# Patient Record
Sex: Female | Born: 1970 | Race: White | Hispanic: No | Marital: Married | Smoking: Never smoker
Health system: Southern US, Community
[De-identification: ages and names within clinical notes are randomized; demographics above are authoritative.]

## PROBLEM LIST (undated history)

## (undated) DIAGNOSIS — B9681 Helicobacter pylori [H. pylori] as the cause of diseases classified elsewhere: Secondary | ICD-10-CM

## (undated) DIAGNOSIS — Z973 Presence of spectacles and contact lenses: Secondary | ICD-10-CM

## (undated) DIAGNOSIS — K759 Inflammatory liver disease, unspecified: Secondary | ICD-10-CM

## (undated) DIAGNOSIS — Z9889 Other specified postprocedural states: Secondary | ICD-10-CM

## (undated) DIAGNOSIS — N281 Cyst of kidney, acquired: Secondary | ICD-10-CM

## (undated) HISTORY — PX: OTHER SURGICAL HISTORY: SHX169

---

## 2009-12-14 ENCOUNTER — Inpatient Hospital Stay (HOSPITAL_COMMUNITY): Admission: RE | Admit: 2009-12-14 | Discharge: 2009-12-16 | Payer: Self-pay | Admitting: Obstetrics & Gynecology

## 2009-12-17 ENCOUNTER — Inpatient Hospital Stay (HOSPITAL_COMMUNITY): Admission: AD | Admit: 2009-12-17 | Discharge: 2009-12-17 | Payer: Self-pay | Admitting: Obstetrics and Gynecology

## 2010-09-10 LAB — CBC
Hemoglobin: 13.1 g/dL (ref 12.0–15.0)
MCH: 34.3 pg — ABNORMAL HIGH (ref 26.0–34.0)
MCHC: 35.3 g/dL (ref 30.0–36.0)
MCHC: 35.4 g/dL (ref 30.0–36.0)
MCV: 95.9 fL (ref 78.0–100.0)
Platelets: 120 10*3/uL — ABNORMAL LOW (ref 150–400)
Platelets: 139 10*3/uL — ABNORMAL LOW (ref 150–400)
RDW: 14 % (ref 11.5–15.5)
RDW: 14.1 % (ref 11.5–15.5)
RDW: 14.4 % (ref 11.5–15.5)
WBC: 13.1 10*3/uL — ABNORMAL HIGH (ref 4.0–10.5)

## 2010-09-10 LAB — COMPREHENSIVE METABOLIC PANEL
ALT: 17 U/L (ref 0–35)
AST: 25 U/L (ref 0–37)
Albumin: 2.8 g/dL — ABNORMAL LOW (ref 3.5–5.2)
Calcium: 8.6 mg/dL (ref 8.4–10.5)
GFR calc Af Amer: >60 mL/min (ref >60)
Sodium: 138 mEq/L (ref 135–145)
Total Protein: 5.6 g/dL — ABNORMAL LOW (ref 6.0–8.3)

## 2012-04-22 ENCOUNTER — Encounter: Payer: Self-pay | Admitting: Family Medicine

## 2012-04-22 ENCOUNTER — Ambulatory Visit (INDEPENDENT_AMBULATORY_CARE_PROVIDER_SITE_OTHER): Payer: 59 | Admitting: Family Medicine

## 2012-04-22 VITALS — BP 112/72 | HR 82 | Temp 98.1°F | Resp 16 | Ht 61.5 in | Wt 151.0 lb

## 2012-04-22 DIAGNOSIS — Z0000 Encounter for general adult medical examination without abnormal findings: Secondary | ICD-10-CM

## 2012-04-22 DIAGNOSIS — K219 Gastro-esophageal reflux disease without esophagitis: Secondary | ICD-10-CM

## 2012-04-22 DIAGNOSIS — J309 Allergic rhinitis, unspecified: Secondary | ICD-10-CM

## 2012-04-22 LAB — POCT URINALYSIS DIPSTICK
Bilirubin, UA: NEGATIVE
Glucose, UA: NEGATIVE
Nitrite, UA: NEGATIVE

## 2012-04-22 LAB — LIPID PANEL
HDL: 41 mg/dL (ref >39)
Triglycerides: 82 mg/dL (ref <150)

## 2012-04-22 LAB — COMPREHENSIVE METABOLIC PANEL
AST: 17 U/L (ref 0–37)
Albumin: 4.2 g/dL (ref 3.5–5.2)
BUN: 16 mg/dL (ref 6–23)
Calcium: 9.4 mg/dL (ref 8.4–10.5)
Chloride: 106 mEq/L (ref 96–112)
Glucose, Bld: 91 mg/dL (ref 70–99)
Potassium: 4.4 mEq/L (ref 3.5–5.3)

## 2012-04-22 MED ORDER — OMEPRAZOLE 20 MG PO CPDR: 20.0000 mg | DELAYED_RELEASE_CAPSULE | Freq: Every day | ORAL | Status: DC

## 2012-04-22 NOTE — Patient Instructions (Addendum)
Keeping You Healthy  Get These Tests 1. Blood Pressure- Have your blood pressure checked once a year by your health care provider.  Normal blood pressure is 120/80. 2. Weight- Have your body mass index (BMI) calculated to screen for obesity.  BMI is measure of body fat based on height and weight.  You can also calculate your own BMI at [email or web address]-[email or web address] 3. Cholesterol- Have your cholesterol checked every 5 years starting at age 39 then yearly starting at age 64. 4. Chlamydia, HIV, and other sexually transmitted diseases- Get screened every year until age 4, then within three months of each new sexual provider. 5. Pap Smear- Every 1-3 years; discuss with your health care provider. 6. Mammogram- Every year starting at age 60  Take these medicines  Calcium with Vitamin D-Your body needs 1200 mg of Calcium each day and [phone] IU of Vitamin D daily.  Your body can only absorb 500 mg of Calcium at a time so Calcium must be taken in 2 or 3 divided doses throughout the day.  Multivitamin with folic acid- Once daily if it is possible for you to become pregnant.  Get these Immunizations  Gardasil-Series of three doses; prevents HPV related illness such as genital warts and cervical cancer.  Menactra-Single dose; prevents meningitis.  Tetanus shot- Every 10 years.  Flu shot-Every year.  Take these steps 1. Do not smoke-Your healthcare provider can help you quit.  For tips on how to quit go to www.smokefree.gov or call 1-800 QUITNOW. 2. Be physically active- Exercise 5 days a week for at least 30 minutes.  If you are not already physically active, start slow and gradually work up to 30 minutes of moderate physical activity.  Examples of moderate activity include walking briskly, dancing, swimming, bicycling, etc. 3. Breast Cancer- A self breast exam every month is important for early detection of breast cancer.  For more information and instruction on self breast exams, ask your  healthcare provider or [email or web address]. 4. Eat a healthy diet- Eat a variety of healthy foods such as fruits, vegetables, whole grains, low fat milk, low fat cheeses, yogurt, lean meats, poultry and fish, beans, nuts, tofu, etc.  For more information go to www. Thenutritionsource.org 5. Drink alcohol in moderation- Limit alcohol intake to one drink or less per day. Never drink and drive. 6. Depression- Your emotional health is as important as your physical health.  If you're feeling down or losing interest in things you normally enjoy please talk to your healthcare provider about being screened for depression. 7. Dental visit- Brush and floss your teeth twice daily; visit your dentist twice a year. 8. Eye doctor- Get an eye exam at least every 2 years. 9. Helmet use- Always wear a helmet when riding a bicycle, motorcycle, rollerblading or skateboarding. 10. Safe sex- If you may be exposed to sexually transmitted infections, use a condom. 11. Seat belts- Seat belts can save your live; always wear one. 12. Smoke/Carbon Monoxide detectors- These detectors need to be installed on the appropriate level of your home. Replace batteries at least once a year. 13. Skin cancer- When out in the sun please cover up and use sunscreen 15 SPF or higher. 14. Violence- If anyone is threatening or hurting you, please tell your healthcare provider.    I recommend you get a multivitamin for Women under age 30  And take 1 tablet daily along with Vitamin D3 supplement (1000- 2000) IU and take 1 capsule daily.   ??????? ???????? D (Vitamin D  Deficiency) ??????? D ?????? ?????? ???? ? ????????? ????????. ?????? ??????? ???????? D ?????????? ??? ??????????? ??? ?????????. ???????????? ?????????? ???????? D ????? ???????? ? ??????????? ?????? ? ????? ???????? ?????? ???????????, ??? ?????.  ??????? D ????? ??? ????????? ?? ?????? ????????:   ?? ????????? ? ???????? 2-? ????????? - ??????? ?  ???????.  ??????? D ???????? ? ??????????? ???????? ?????? ???????.  ?? ????? ????????????? ????? ???????????, ??? ?????? ? ?????????? ???????.  ?? ????????? ????? ? ??????. ??????? D ???????? ? ???????? ??????? ?????????. ?? ?????? ? ?????? ????????? ????????? ???????. ????????? D ????? ????????? ????????? ???????? ???????? ? ?????? ? ????. ????????? ???? ????????? ??????? ???????, ?????????? ??????? D. ????? ??????? D ????????????? ? ????????? ???????? ??? ???????????? ?????????? ?????????. ? ???????? ??????? ????? ?????????? ????????? ???????????? ??? ????????? ????? ????????, ??????? ??? ???????? ????? ????????????.  ???????   ?????????? ????????? ???????, ?????????? ??????? D.  ???????? ?????????? ?????.  ??????? ????????? ????????? ??????????????? ???????, ??-?? ??????? ?????????? ????????? ???????? D. ? ????? ????? ??????????? ?????? ??????? ????? (??????????????? ???????), ??????????? ??????????, ???????????? (????????-????????? ???????????).  ????????????? ???????? ?? ???????? ????? ??????? ??? ?????? ?????.  ????????. ??????? ?????? ??????????? ??????? D ?? ?????. ??? ??????, ??? ? ?????? ????? ????? ??????????? ?????????? ???????? D ? ????? ? ? ?????? ?????? ?????????.  ??????????? ???????? ??????????????? ??? ??????????? ??????????? ??????. ??????? ?????  ??????? ????? - ???????, ??? ??????? ??????? ??????????? ???????? ?????????? ???????? D ?????????????. ??? ???????? ? ????:   ????????.  ?????????? ??????????? ????????? ??????????? ??????? ?? ???????? ???????.  ?????????? ? ???? ???????????.  ?????? ????????? ??????.  ?????? ??? ????? ????? (??????????).  ??????????? ??? ????????????, ? ?????????? ??????? ?????????? ????????? ???????? D ??????????.  ?????? ????.  ????? ????????? ??????????, ????????, ???????? ?? ????????? (????????, ?????????) ??? ?????????.  ?????????? ??? (????????). ????????  ?????????????? ?????????? ???????? D ????? ?? ????? ?????-????  ??????????? ?????????. ???? ? ??? ???????????? ???????? ???????? D, ???????? ????? ???????? ? ????:   ???? ? ??????.  ???????? ????.  ?????? ???????.  ?????? ???????? ????? ?????????????? ????? ??-?? ???????????? ???????????. ???????  ?????? ????? - ?????? ?????? ??????????? ???????? ???????? D.  ???????  ??????? ???????? D ????? ?????????? ?????????? ?????????. ??????? ???????? ???????? D ??????? ?? ?????? ??? ?????????????. ???????? ???????:   ????? ???????, ?????????? ??????? D.  ????? ???????, ?????????? ???????. ??? ??????? ???? ?????, ????? ??????? ??????? ?????? ???????? ???. ?????????? ?? ????? ? ???????? ????????   ?????????? ??? ??????????? ??????? ???????. ???????? ???? ??????????? ?????. ?????????? ?????? ??????????? ?????/?????????? ?????????.  ???????? ??????? ????? ?????? 2 ?????? ????? ?????? ????? ??????????.  ???????????? ? ???? ????????, ?????????? ??????? D. ??????????????? ???????? ????????:  ??????????? ???????? ????????, ???? ??? ????. "???????????" ????????, ??? ??????? D ??? ???????? ? ???????. ??????? ???????? ?? ????????, ????? ???? ??????????.  ?????? ????, ????????, ?????? ??? ??????.  ????.  ???????.  ????????? ?????? ??????? ?? ??????. ??????????? ????? ????????????? ????????? ??? ???????? ??????? ??? ??????????????? ????? ?? 10 ?? 15 ?????. ?????????? ???????? ????? 3 ???? ? ??????. ?????, ??????? ????????? ??? ????, ?? ????????????? ????????? ????????? ?????. ???????? ? ?????? ???????? ?????, ??????? ??????? ??? ????? ????????? ??? ???????? ???????. ?? ????????? ?? ??????? ??? ??????.  ????????????? ????? ???? ?? ?????????? ??????. ???? ??????????, ???????? ???.  ??????? ?? ??? ??????????? ?????? ? ?????. ??? ??????? ???? ???????? ?????? ?????, ????? ??????? ? ????????????? ???????? ??????? ???????? ???????? D. ?????????? ? ?????, ????:   ? ??? ???? ??????? ? ??????????? ??? ???????.  ? ??? ???????? ???????? ???????? ???????? D.  ? ???  ??????? ??? ?????.  ? ??? ?????.  ?? ?????????? ?????????? ????????.  ? ??? ???????? ?????? ???? ? ??????? ??????? ??? ? ?????. ?????????, ??? ??:   ????????? ????????? ??????????.  ?????? ?????????????? ??????? ?? ??????????.  ??????????????? ?????????? ? ?????, ???? ??? ?? ?????????? ????? ??? ?????????? ????.  Document Released: 09/03/2011 [facility] Patient Information 2013 [employer], [place].   ?????? ?? ????? ???????????? (Heartburn During Pregnancy)  ?????? - ???????? ?????? ? ??????? ??????, ??????? ??????? ?????????? ??????????????? ????? (?????????? ???????) ? ???????. ?????? (????? ????????? ??? "???????") ????? ????????? ?? ????? ???????????? ??-?? ????????? ? ???????????? ??????? (????????????) ? ?????. ??????????? ????? ????????? ? ????, ??? ????????????? ?????? ????? ????????? ? ????????. ????? ?????? ??????? ???????? ? ???????, ??????? ??????. ?????? ????? ????? ????????? ??-?? ?????????? ?????, ??????? ????? ?? ???????, ??????? ????????? ??????? ? ???????. ??? ???????? ????? ??????????? ?? ????????? ??????? ????????????. ?????? ?????? ???????????? ????? ?????. ???????  ???????????.  ????????? ????????????? ????.  ?????????? ???????? ?????, ??-?? ???? ?????????? ???????????? ??????????????? ??????? ?????.  ??????? ?????? ???.  ???????????? ???????? ? ???????.  ?????????? ????????.  ?????????? ???????????? ??????????????? ???????. ????????  ?????? ? ????? ??? ?????.  ??????? ??????? ?? ???.  ??????. ??????????? ?????? ?????? ??????????????? ?????? ?? ????????? ???????? ?????????. ??? ??????? ???? ????? ????????? ?????? ????? ?? ??????????? ????????????? ???? ????????, ??????? ?????? ?????? ??? ??????. ?????? ?????? ??????????????? ????? ?????????? ????????? ?? ??????, ????? ????????, ?????????? ?? ????????. ?? ????? ???????????? ????????? ??? ????????? ?????????? ??????????? ?????. ?????????? - ???????????? ???????? ? ??????? ??? ?????? ????? ? ??????? ?  ????????????? ??????????? ?? ??????????? ?????.  ???????  ??? ??????? ???? ????? ????????? ????? ????????? ?????????????? ?????????? (????????, ???????? ?? ??????, ?????????????? ???????) ??? ??????????? ????????? ?????? ??????.  ??? ??????? ???? ????? ????????? ????? ??????????, ??????? ????????? ?????????? ??????????????? ??????? ? ??????? ??? ???????? ????????? ???????? ???????.  ??? ??????? ???? ????? ????????????? ???????? ?????? ???????.  ? ??????? ??????? ??? ??????? ???? ????? ????????????? ???????????? ???????? ???????, ???? ?? ???????? ???????. (??? ?? ??????? ?????????? ??????? ?? ???????? ??????????? ????????, ?.?. ??? ???????? ?????? ? ????????? ????????? ?????? ?? ????? ??? ? ????? ?? ?????? ?? ???????? ???????? - ????????? ??????? ? ???????). ?????????? ?? ??????? ? ???????? ????????  ?????????? ????????????? ????????? ? ???????????? ? ???????????? ?????.  ???????????? ???????? ???????, ???????? ???-?????? ??????? ??? ?????, ???? ??? ???? ????????????? ??????? ??????.  ?? ??????? ?????????? ?????????? ????? ????? ???.  ????????? ??????? ???? ?? 2-3 ???? ????? ??????? ?? ???.  ?? ???????? ????? ????? ???.  ????? ?????????? ???????? ????????? ??? ? ???? ?????? 3 ??????? ??????? ????.  ?????????? ???????? ? ???????, ??????? ???????? ???????? ? ?? ???????????? ?? ? ????. ????????, ??????? ??????? ????????? ?? ???????:  ???????? ????????.  ???????.  ?????? ????, ??????? ????????.  ?????? ?????.  ??????, ???.  ??????????, ??????? ?????????, ?????????, ?????? ? ????.  ????????, ?????????? ????????.  ????.  ???????????? ??????? ??? ??????? ? ????????.  ?????. ?????????? ?????????? ? ?????, ????:  ? ??? ???????? ?????? ???? ? ?????, ??????? ?????? ? ???? ??? ???????, ??? ? ???.  ? ??? ??????? ??????????, ?????????????? ??? ???????? ? ??????.  ?? ??????? ???????.  ? ??? ????? ??????.  ??? ?????? ??? ?????? ???????.  ? ??? ???????? ?????? ?????????????  ????.  ???????? ?????? ????????? ???? ???? ??? ? ?????? ?? ?????????? ????? ???? ??????. ?????????, ??? ??:   ????????? ????????? ??????????.  ?????? ?????????????? ??????? ?? ??????????.  ??????????????? ?????????? ? ?????, ???? ??? ?? ?????????? ????? ??? ?????????? ????. Document Released: 06/11/2005 Document Revised: 09/03/2011 [facility] Patient Information 2013 [place], [place].

## 2012-04-23 ENCOUNTER — Encounter: Payer: Self-pay | Admitting: Family Medicine

## 2012-04-23 DIAGNOSIS — J309 Allergic rhinitis, unspecified: Secondary | ICD-10-CM | POA: Insufficient documentation

## 2012-04-23 DIAGNOSIS — E663 Overweight: Secondary | ICD-10-CM | POA: Insufficient documentation

## 2012-04-23 DIAGNOSIS — K219 Gastro-esophageal reflux disease without esophagitis: Secondary | ICD-10-CM | POA: Insufficient documentation

## 2012-04-23 LAB — VITAMIN D 25 HYDROXY (VIT D DEFICIENCY, FRACTURES): Vit D, 25-Hydroxy: 22 ng/mL — ABNORMAL LOW (ref 30–89)

## 2012-04-23 NOTE — Progress Notes (Signed)
Subjective:    Patient ID: Sue Watkins, female    DOB: 1971-03-30, 41 y.o.   MRN: [id number]  HPI  This 41 y.o. [place] female is here for CPE/ health screening for her job at [employer]. She c/o URI congestion with sore throat and ithcy eyes for several weeks. She has been afebrile. Cough is nonproductive. She has heartburn and was evaluated for GERD by Dr. Elnoria Howard about  2 years ago and OTC medication recommended because pt was breast-feeding at that time.  She stopped nursing in August 2013 and tried OTC Prilosec which did help; she is not  currently taking this med. OTC Dayquil has helped with URI symptoms.  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    Review of Systems  Constitutional: Negative.   HENT: Positive for congestion, sore throat, postnasal drip and sinus pressure. Negative for ear pain, facial swelling, rhinorrhea, mouth sores, trouble swallowing and voice change.   Eyes: Positive for itching.  Respiratory: Positive for cough. Negative for choking, chest tightness and shortness of breath.   Cardiovascular: Negative.   Gastrointestinal: Positive for blood in stool. Negative for nausea, abdominal pain, diarrhea and constipation.  Genitourinary: Negative.   All other systems reviewed and are negative.         Objective:   Physical Exam  Nursing note and vitals reviewed. Constitutional: She is oriented to person, place, and time. She appears well-developed and well-nourished. No distress.  HENT:  Head: Normocephalic and atraumatic.  Right Ear: Hearing, tympanic membrane, external ear and ear canal normal.  Left Ear: Hearing, tympanic membrane, external ear and ear canal normal.  Nose: Mucosal edema present. No rhinorrhea, nasal deformity or septal deviation. Right sinus exhibits no maxillary sinus tenderness and no frontal sinus tenderness. Left sinus exhibits no maxillary sinus  tenderness and no frontal sinus tenderness.  Mouth/Throat: Uvula is midline and mucous membranes are normal. No oral lesions. Normal dentition. No dental caries. Posterior oropharyngeal erythema present. No oropharyngeal exudate.  Eyes: Conjunctivae normal, EOM and lids are normal. Pupils are equal, round, and reactive to light. No scleral icterus.  Fundoscopic exam:      The right eye shows red reflex.      The left eye shows red reflex. Neck: Normal range of motion. Neck supple. No thyromegaly present.  Cardiovascular: Normal rate, regular rhythm, normal heart sounds and intact distal pulses.  Exam reveals no gallop and no friction rub.   No murmur heard. Pulmonary/Chest: Effort normal and breath sounds normal. No respiratory distress. She has no wheezes.  Abdominal: Soft. Bowel sounds are normal. She exhibits no distension and no mass. There is no hepatosplenomegaly. There is no tenderness. There is no guarding and no CVA tenderness.  Musculoskeletal: Normal range of motion. She exhibits no edema and no tenderness.  Lymphadenopathy:    She has no cervical adenopathy.  Neurological: She is alert and oriented to person, place, and time. She has normal reflexes. No cranial nerve deficit. She exhibits normal muscle tone. Coordination normal.  Skin: Skin is warm and dry. No rash noted. No erythema. No pallor.  Psychiatric: She has a normal mood and affect. Her behavior is normal. Judgment and thought content normal.          Assessment & Plan:   1. Routine general medical examination at a health care facility  Comprehensive metabolic panel, Lipid panel, Vitamin D 25 hydroxy, POCT  urinalysis dipstick  2. GERD (gastroesophageal reflux disease)  RX: Omeprazole 20 mg 1 capsule every AM  3. Allergic rhinitis  OTC generic Zyrtec or Allegra

## 2012-04-24 NOTE — Progress Notes (Signed)
Quick Note:  Please call pt and advise that the following labs are abnormal...  Vit D level is below normal; I did discuss with pt need for her to get OTC Vitamin D3 and take it in addition to multivitamin for women. Also increase fish intake along with mushrooms and some Vit D-fortified dairy products.  Get some sun exposure most days of the week.  All other labs are normal.  Copy to pt. ______

## 2012-04-29 ENCOUNTER — Telehealth: Payer: Self-pay

## 2012-04-29 NOTE — Telephone Encounter (Signed)
I spoke with husband. Had ?'s about her glucose because her ins company called and said she was pre-diabetic. I reassured her that her sugar was 91 and that was WNL and does not put her in the pre-diabetic range.

## 2012-04-29 NOTE — Telephone Encounter (Signed)
PT WOULD LIKE TO DISCUSS HER LAB RESULTS WITH SOMEONE. PLEASE CALL [phone]

## 2013-12-11 DIAGNOSIS — N840 Polyp of corpus uteri: Secondary | ICD-10-CM | POA: Insufficient documentation

## 2014-11-04 ENCOUNTER — Ambulatory Visit: Payer: Self-pay | Admitting: Podiatry

## 2014-11-10 ENCOUNTER — Ambulatory Visit: Payer: Self-pay | Admitting: Podiatry

## 2015-11-01 DIAGNOSIS — Z309 Encounter for contraceptive management, unspecified: Secondary | ICD-10-CM | POA: Diagnosis not present

## 2016-02-15 DIAGNOSIS — N644 Mastodynia: Secondary | ICD-10-CM | POA: Diagnosis not present

## 2016-02-23 DIAGNOSIS — N644 Mastodynia: Secondary | ICD-10-CM | POA: Diagnosis not present

## 2016-03-16 DIAGNOSIS — J309 Allergic rhinitis, unspecified: Secondary | ICD-10-CM | POA: Diagnosis not present

## 2016-03-16 DIAGNOSIS — K219 Gastro-esophageal reflux disease without esophagitis: Secondary | ICD-10-CM | POA: Diagnosis not present

## 2016-03-16 DIAGNOSIS — E663 Overweight: Secondary | ICD-10-CM | POA: Diagnosis not present

## 2016-03-16 DIAGNOSIS — J019 Acute sinusitis, unspecified: Secondary | ICD-10-CM | POA: Diagnosis not present

## 2016-03-23 ENCOUNTER — Telehealth: Payer: Self-pay | Admitting: Emergency Medicine

## 2016-03-23 NOTE — Telephone Encounter (Signed)
Pt called and wanted to know if you would except her and her husband Wilmon Armsndrew Irvin as new patients? Please advise thanks.

## 2016-03-27 NOTE — Telephone Encounter (Signed)
Ok Thx 

## 2016-04-09 DIAGNOSIS — N970 Female infertility associated with anovulation: Secondary | ICD-10-CM | POA: Diagnosis not present

## 2016-04-12 DIAGNOSIS — N970 Female infertility associated with anovulation: Secondary | ICD-10-CM | POA: Diagnosis not present

## 2016-04-13 DIAGNOSIS — Z3189 Encounter for other procreative management: Secondary | ICD-10-CM | POA: Diagnosis not present

## 2016-04-20 ENCOUNTER — Ambulatory Visit: Payer: Self-pay | Admitting: Internal Medicine

## 2016-04-30 ENCOUNTER — Ambulatory Visit (INDEPENDENT_AMBULATORY_CARE_PROVIDER_SITE_OTHER): Payer: BLUE CROSS/BLUE SHIELD | Admitting: Internal Medicine

## 2016-04-30 ENCOUNTER — Encounter: Payer: Self-pay | Admitting: Internal Medicine

## 2016-04-30 VITALS — BP 130/88 | HR 77 | Ht 61.0 in | Wt 146.0 lb

## 2016-04-30 DIAGNOSIS — K219 Gastro-esophageal reflux disease without esophagitis: Secondary | ICD-10-CM | POA: Diagnosis not present

## 2016-04-30 DIAGNOSIS — Z0000 Encounter for general adult medical examination without abnormal findings: Secondary | ICD-10-CM

## 2016-04-30 DIAGNOSIS — D509 Iron deficiency anemia, unspecified: Secondary | ICD-10-CM

## 2016-04-30 DIAGNOSIS — D649 Anemia, unspecified: Secondary | ICD-10-CM | POA: Insufficient documentation

## 2016-04-30 DIAGNOSIS — J320 Chronic maxillary sinusitis: Secondary | ICD-10-CM | POA: Diagnosis not present

## 2016-04-30 DIAGNOSIS — F4320 Adjustment disorder, unspecified: Secondary | ICD-10-CM

## 2016-04-30 DIAGNOSIS — F4321 Adjustment disorder with depressed mood: Secondary | ICD-10-CM

## 2016-04-30 DIAGNOSIS — J329 Chronic sinusitis, unspecified: Secondary | ICD-10-CM | POA: Insufficient documentation

## 2016-04-30 MED ORDER — CEFDINIR 300 MG PO CAPS: 300.0000 mg | ORAL_CAPSULE | Freq: Two times a day (BID) | ORAL | 0 refills | Status: DC

## 2016-04-30 MED ORDER — RANITIDINE HCL 150 MG PO TABS: 150.0000 mg | ORAL_TABLET | Freq: Two times a day (BID) | ORAL | 3 refills | Status: DC

## 2016-04-30 MED ORDER — CEFDINIR 300 MG PO CAPS
300.0000 mg | ORAL_CAPSULE | Freq: Two times a day (BID) | ORAL | 0 refills | Status: DC
Start: 2016-04-30 — End: 2016-04-30

## 2016-04-30 MED ORDER — VITAMIN D3 50 MCG (2000 UT) PO CAPS
2000.0000 [IU] | ORAL_CAPSULE | Freq: Every day | ORAL | 3 refills | Status: DC
Start: 2016-04-30 — End: 2017-05-30

## 2016-04-30 NOTE — Assessment & Plan Note (Signed)
We discussed age appropriate health related issues, including available/recomended screening tests and vaccinations. We discussed a need for adhering to healthy diet and exercise. Labs/EKG were ordered. All questions were answered. 

## 2016-04-30 NOTE — Assessment & Plan Note (Signed)
Cefdinir

## 2016-04-30 NOTE — Assessment & Plan Note (Signed)
B12 iron

## 2016-04-30 NOTE — Progress Notes (Signed)
Subjective:  Patient ID: Sue Watkins, female    DOB: [birth year]  Age: 45 y.o. MRN: [id number]  CC: Establish Care   Chest Pain   This is a new problem. The current episode started more than 1 month ago. The onset quality is undetermined. The problem occurs every several days. The problem has been waxing and waning. The pain is present in the lateral region. The pain is mild. The quality of the pain is described as sharp. The pain radiates to the left shoulder. Pertinent negatives include no back pain, cough, diaphoresis, dizziness, fever, headaches, nausea, numbness, palpitations, shortness of breath or weakness. The pain is aggravated by nothing. She has tried nothing for the symptoms. The treatment provided no relief. Risk factors include stress.  Pertinent negatives for past medical history include no diabetes, no hyperlipidemia, no seizures and no TIA.   Sue MuttersNatalya Y Watkin presents for a new pt visit C/o GERD - h/o H pylori - treated in 2016, does have some sx's C/o allergies  - green nasal d/c x 2 mo Mom died of MI last year C/o occ CP in the L shoulder   Outpatient Medications Prior to Visit  Medication Sig Dispense Refill  . omeprazole (PRILOSEC) 20 MG capsule Take 1 capsule (20 mg total) by mouth daily. (Patient not taking: Reported on 04/30/2016) 30 capsule 3   No facility-administered medications prior to visit.     ROS Review of Systems  Constitutional: Positive for fatigue. Negative for activity change, appetite change, chills, diaphoresis, fever and unexpected weight change.  HENT: Positive for postnasal drip, rhinorrhea and sinus pain. Negative for congestion, ear pain, facial swelling, hearing loss, mouth sores, nosebleeds, sinus pressure, sneezing, sore throat, tinnitus and trouble swallowing.   Eyes: Negative for pain, discharge, redness, itching and visual disturbance.  Respiratory: Negative for cough, chest tightness, shortness of breath, wheezing and stridor.     Cardiovascular: Negative for chest pain, palpitations and leg swelling.  Gastrointestinal: Negative for abdominal distention, anal bleeding, blood in stool, constipation, diarrhea, nausea and rectal pain.  Genitourinary: Negative for difficulty urinating, dysuria, flank pain, frequency, genital sores, hematuria, pelvic pain, urgency, vaginal bleeding and vaginal discharge.  Musculoskeletal: Negative for arthralgias, back pain, gait problem, joint swelling, neck pain and neck stiffness.  Skin: Negative.  Negative for rash.  Neurological: Negative for dizziness, tremors, seizures, syncope, speech difficulty, weakness, numbness and headaches.  Hematological: Negative for adenopathy. Does not bruise/bleed easily.  Psychiatric/Behavioral: Negative for behavioral problems, decreased concentration, dysphoric mood and sleep disturbance. The patient is nervous/anxious.     Objective:  BP 130/88   Pulse 77   Ht 5\' 1"  (1.549 m)   Wt 146 lb (66.2 kg)   SpO2 97%   BMI 27.59 kg/m   BP Readings from Last 3 Encounters:  04/30/16 130/88  04/22/12 112/72    Wt Readings from Last 3 Encounters:  04/30/16 146 lb (66.2 kg)  04/22/12 151 lb (68.5 kg)    Physical Exam  Constitutional: She appears well-developed. No distress.  HENT:  Head: Normocephalic.  Right Ear: External ear normal.  Left Ear: External ear normal.  Nose: Nose normal.  Mouth/Throat: Oropharynx is clear and moist.  Eyes: Conjunctivae are normal. Pupils are equal, round, and reactive to light. Right eye exhibits no discharge. Left eye exhibits no discharge.  Neck: Normal range of motion. Neck supple. No JVD present. No tracheal deviation present. No thyromegaly present.  Cardiovascular: Normal rate, regular rhythm and normal heart sounds.  Pulmonary/Chest: No stridor. No respiratory distress. She has no wheezes.  Abdominal: Soft. Bowel sounds are normal. She exhibits no distension and no mass. There is no tenderness. There is no  rebound and no guarding.  Musculoskeletal: She exhibits no edema or tenderness.  Lymphadenopathy:    She has no cervical adenopathy.  Neurological: She displays normal reflexes. No cranial nerve deficit. She exhibits normal muscle tone. Coordination normal.  Skin: No rash noted. No erythema.  Psychiatric: She has a normal mood and affect. Her behavior is normal. Judgment and thought content normal.     Procedure: EKG Indication: chest pain Impression: NSR. No acute changes.  Lab Results  Component Value Date   WBC 9.1 12/17/2009   HGB 9.3 (L) 12/17/2009   HCT 26.1 (L) 12/17/2009   PLT 139 (L) 12/17/2009   GLUCOSE 91 04/22/2012   CHOL 147 04/22/2012   TRIG 82 04/22/2012   HDL 41 04/22/2012   LDLCALC 90 04/22/2012   ALT 21 04/22/2012   AST 17 04/22/2012   NA 138 04/22/2012   K 4.4 04/22/2012   CL 106 04/22/2012   CREATININE 0.67 04/22/2012   BUN 16 04/22/2012   CO2 23 04/22/2012    No results found.  Assessment & Plan:   There are no diagnoses linked to this encounter. I am having Ms. Loken maintain her omeprazole.  No orders of the defined types were placed in this encounter.    Follow-up: No Follow-up on file.  Sonda PrimesAlex Plotnikov, MD

## 2016-04-30 NOTE — Progress Notes (Signed)
Pre visit review using our clinic review tool, if applicable. No additional management support is needed unless otherwise documented below in the visit note. 

## 2016-04-30 NOTE — Assessment & Plan Note (Signed)
Zantac 

## 2016-04-30 NOTE — Assessment & Plan Note (Signed)
Mom died in 2016 Discussed

## 2016-05-02 ENCOUNTER — Other Ambulatory Visit (INDEPENDENT_AMBULATORY_CARE_PROVIDER_SITE_OTHER): Payer: BLUE CROSS/BLUE SHIELD

## 2016-05-02 DIAGNOSIS — Z0000 Encounter for general adult medical examination without abnormal findings: Secondary | ICD-10-CM | POA: Diagnosis not present

## 2016-05-02 LAB — URINALYSIS, ROUTINE W REFLEX MICROSCOPIC
Bilirubin Urine: NEGATIVE
Ketones, ur: NEGATIVE
Nitrite: NEGATIVE
Specific Gravity, Urine: >=1.03 — AB
Urine Glucose: NEGATIVE
Urobilinogen, UA: 0.2
pH: 5.5 (ref 5.0–8.0)

## 2016-05-02 LAB — LIPID PANEL
Cholesterol: 142 mg/dL (ref 0–200)
HDL: 45 mg/dL
LDL Cholesterol: 81 mg/dL (ref 0–99)
NonHDL: 97.12
Total CHOL/HDL Ratio: 3
Triglycerides: 82 mg/dL (ref 0.0–149.0)
VLDL: 16.4 mg/dL (ref 0.0–40.0)

## 2016-05-02 LAB — HEPATIC FUNCTION PANEL
ALT: 13 U/L (ref 0–35)
AST: 16 U/L (ref 0–37)
Albumin: 4.1 g/dL (ref 3.5–5.2)
Alkaline Phosphatase: 47 U/L (ref 39–117)
Bilirubin, Direct: 0.1 mg/dL (ref 0.0–0.3)
Total Bilirubin: 0.5 mg/dL (ref 0.2–1.2)
Total Protein: 7.4 g/dL (ref 6.0–8.3)

## 2016-05-02 LAB — CBC WITH DIFFERENTIAL/PLATELET
Basophils Absolute: 0 K/uL (ref 0.0–0.1)
Basophils Relative: 0.6 % (ref 0.0–3.0)
Eosinophils Absolute: 0.3 K/uL (ref 0.0–0.7)
Eosinophils Relative: 3.1 % (ref 0.0–5.0)
HCT: 40.3 % (ref 36.0–46.0)
Hemoglobin: 13.2 g/dL (ref 12.0–15.0)
Lymphocytes Relative: 36.1 % (ref 12.0–46.0)
Lymphs Abs: 3 K/uL (ref 0.7–4.0)
MCHC: 32.6 g/dL (ref 30.0–36.0)
MCV: 89.3 fl (ref 78.0–100.0)
Monocytes Absolute: 0.6 K/uL (ref 0.1–1.0)
Monocytes Relative: 7.6 % (ref 3.0–12.0)
Neutro Abs: 4.4 K/uL (ref 1.4–7.7)
Neutrophils Relative %: 52.6 % (ref 43.0–77.0)
Platelets: 203 K/uL (ref 150.0–400.0)
RBC: 4.51 Mil/uL (ref 3.87–5.11)
RDW: 13.6 % (ref 11.5–15.5)
WBC: 8.3 K/uL (ref 4.0–10.5)

## 2016-05-02 LAB — BASIC METABOLIC PANEL WITH GFR
BUN: 15 mg/dL (ref 6–23)
CO2: 28 meq/L (ref 19–32)
Calcium: 9.5 mg/dL (ref 8.4–10.5)
Chloride: 105 meq/L (ref 96–112)
Creatinine, Ser: 0.77 mg/dL (ref 0.40–1.20)
GFR: 86.8 mL/min
Glucose, Bld: 88 mg/dL (ref 70–99)
Potassium: 4.2 meq/L (ref 3.5–5.1)
Sodium: 139 meq/L (ref 135–145)

## 2016-05-02 LAB — TSH: TSH: 2.45 u[IU]/mL (ref 0.35–4.50)

## 2016-07-02 ENCOUNTER — Ambulatory Visit: Payer: BLUE CROSS/BLUE SHIELD | Admitting: Internal Medicine

## 2016-07-06 ENCOUNTER — Ambulatory Visit: Payer: Self-pay | Admitting: Internal Medicine

## 2016-08-06 ENCOUNTER — Encounter: Payer: Self-pay | Admitting: Podiatry

## 2016-08-06 ENCOUNTER — Ambulatory Visit (INDEPENDENT_AMBULATORY_CARE_PROVIDER_SITE_OTHER): Payer: BLUE CROSS/BLUE SHIELD | Admitting: Podiatry

## 2016-08-06 VITALS — BP 126/74 | HR 71 | Resp 14

## 2016-08-06 DIAGNOSIS — B351 Tinea unguium: Secondary | ICD-10-CM

## 2016-08-06 MED ORDER — NONFORMULARY OR COMPOUNDED ITEM
2 refills | Status: DC
Start: 2016-08-06 — End: 2017-05-30

## 2016-08-06 NOTE — Progress Notes (Signed)
   Subjective:    Patient ID: Sue Watkins, female    DOB: 1971-03-17, 46 y.o.   MRN: [id number]  HPI 46 year old female presents the office today for concerns of her left fourth toe becoming thick, discolored with yellow discoloration. She does not want any oral medication. She has tried over-the-counter topical medication. She states that she did start laser. This been ongoing for about 5 years. No other complaints today.  Review of Systems  All other systems reviewed and are negative.      Objective:   Physical Exam General: AAO x3, NAD  Dermatological: The left fourth toenail is dystrophic, discolored with yellow to brown discoloration. There is no tenderness to palpation. There is no swelling redness or drainage. No open lesions identified.  Vascular: Dorsalis Pedis artery and Posterior Tibial artery pedal pulses are 2/4 bilateral with immedate capillary fill time. Pedal hair growth present.There is no pain with calf compression, swelling, warmth, erythema.   Neruologic: Grossly intact via light touch bilateral. Vibratory intact via tuning fork bilateral. Protective threshold with Semmes Wienstein monofilament intact to all pedal sites bilateral.  Musculoskeletal: No gross boney pedal deformities bilateral. No pain, crepitus, or limitation noted with foot and ankle range of motion bilateral. Muscular strength 5/5 in all groups tested bilateral.  Gait: Unassisted, Nonantalgic.      Assessment & Plan:  46 year old female with left fourth digit onychomycosis -Treatment options discussed including all alternatives, risks, and complications -Etiology of symptoms were discussed -discussed nail culture/biopsy of thn this and go ahead and start laser. I discussed with her this is not a guarantee of resolution of symptoms and she understands this and still wishes to proceed. I recommend her to continue with topical antifungal medication in conjunction with the laser therapy. Ordered  onychomycosis ointment through [facility]Laserering of Toenails was carried out at today's visit via Q-switch YAG laser by [facility] at continuous on the left 4th digit toenail. Patient and staff were wearing appropriate laser protective goggles/eyewear Laser device was tested prior to use and safety protocols were followed Frequency of 5 Hz, Level 4, Joules 1.3 delivered. The patient tolerated the lasering well without any complications. They were encouraged to call the office with any questions, concerns, change in symptoms.   Ovid CurdMatthew Wagoner, DPM

## 2016-09-03 ENCOUNTER — Ambulatory Visit (INDEPENDENT_AMBULATORY_CARE_PROVIDER_SITE_OTHER): Payer: Self-pay | Admitting: Podiatry

## 2016-09-03 DIAGNOSIS — B351 Tinea unguium: Secondary | ICD-10-CM

## 2016-09-07 NOTE — Progress Notes (Signed)
Pt presents with mycotic infection of nails  All other systems are negative  Laser therapy administered to affected nails and tolerated well. All safety precautions were in place. Re-appointed in 4 weeks for 3rd treatment 

## 2016-10-04 ENCOUNTER — Ambulatory Visit: Payer: BLUE CROSS/BLUE SHIELD

## 2016-10-08 ENCOUNTER — Ambulatory Visit: Payer: BLUE CROSS/BLUE SHIELD

## 2016-10-11 ENCOUNTER — Ambulatory Visit (INDEPENDENT_AMBULATORY_CARE_PROVIDER_SITE_OTHER): Payer: BLUE CROSS/BLUE SHIELD | Admitting: Podiatry

## 2016-10-11 DIAGNOSIS — B351 Tinea unguium: Secondary | ICD-10-CM

## 2016-10-12 NOTE — Progress Notes (Signed)
Pt presents with mycotic infection of nails x 1 nail  All other systems are negative  Laser therapy administered to affected nails and tolerated well. All safety precautions were in place. Re-appointed in 4 weeks for 4th treatment. Patient not to be charged for remaining laser treatments

## 2016-11-07 ENCOUNTER — Encounter: Payer: Self-pay | Admitting: Internal Medicine

## 2016-11-07 ENCOUNTER — Ambulatory Visit (INDEPENDENT_AMBULATORY_CARE_PROVIDER_SITE_OTHER): Payer: BLUE CROSS/BLUE SHIELD | Admitting: Internal Medicine

## 2016-11-07 ENCOUNTER — Ambulatory Visit (INDEPENDENT_AMBULATORY_CARE_PROVIDER_SITE_OTHER)
Admission: RE | Admit: 2016-11-07 | Discharge: 2016-11-07 | Disposition: A | Discharge status: Home / Self Care | Payer: BLUE CROSS/BLUE SHIELD | Source: Ambulatory Visit | Attending: Internal Medicine | Admitting: Internal Medicine

## 2016-11-07 VITALS — BP 126/68 | HR 67 | Temp 98.5°F | Resp 12 | Ht 61.0 in | Wt 145.0 lb

## 2016-11-07 DIAGNOSIS — M25532 Pain in left wrist: Secondary | ICD-10-CM

## 2016-11-07 DIAGNOSIS — M7989 Other specified soft tissue disorders: Secondary | ICD-10-CM | POA: Diagnosis not present

## 2016-11-07 NOTE — Patient Instructions (Signed)
We will check the x-ray and call you back with the results.

## 2016-11-07 NOTE — Assessment & Plan Note (Signed)
Checking x-ray to rule out fracture. No instability on exam and no swelling. Using otc meds for pain and ice as needed.

## 2016-11-07 NOTE — Progress Notes (Signed)
   Subjective:    Patient ID: Vicie MuttersNatalya Y Perris, female    DOB: 1971/06/24, 46 y.o.   MRN: [id number]  HPI The patient is a 46 YO female coming in for left wrist pain after fall from ladder. She was using the wrist after the fall. She was on a step ladder which was uneven. Landed on the left side and some mild pain in her gluteus region and small bruise by her elbow. Tried to catch herself. She is able to move her fingers and bend slowly at the wrist. No significant swelling in the area. She has not taken anything for pain. No LOC with fall and no headache, nausea, vomiting since the fall.   Review of Systems  Constitutional: Negative.   Respiratory: Negative.   Cardiovascular: Negative.   Gastrointestinal: Negative.   Musculoskeletal: Positive for arthralgias and myalgias. Negative for back pain, gait problem, neck pain and neck stiffness.  Neurological: Negative.       Objective:   Physical Exam  Constitutional: She is oriented to person, place, and time. She appears well-developed and well-nourished.  HENT:  Head: Normocephalic and atraumatic.  Eyes: EOM are normal.  Neck: Normal range of motion.  Cardiovascular: Normal rate and regular rhythm.   Pulmonary/Chest: Effort normal.  Abdominal: Soft.  Musculoskeletal: She exhibits tenderness.  Tenderness with flexion and extension of the wrist, no pain with lateral rotation. Pain is located on the radial aspect of the wrist and not radiating. Elbow and shoulder without pain or tenderness. Able to move fingers without pain.   Neurological: She is alert and oriented to person, place, and time. No cranial nerve deficit. Coordination normal.  Skin: Skin is warm and dry. No rash noted.   Vitals:   11/07/16 0905  BP: 126/68  Pulse: 67  Resp: 12  Temp: 98.5 F (36.9 C)  TempSrc: Oral  SpO2: 98%  Weight: 145 lb (65.8 kg)  Height: 5\' 1"  (1.549 m)      Assessment & Plan:

## 2016-11-12 ENCOUNTER — Ambulatory Visit (INDEPENDENT_AMBULATORY_CARE_PROVIDER_SITE_OTHER): Payer: Self-pay | Admitting: Podiatry

## 2016-11-12 DIAGNOSIS — B351 Tinea unguium: Secondary | ICD-10-CM

## 2016-11-13 NOTE — Progress Notes (Signed)
Pt presents with mycotic infection of nails x 1 nail  All other systems are negative  Laser therapy administered to affected nails and tolerated well. All safety precautions were in place. Re-appointed in 4 weeks for 5th treatment. Patient not to be charged for remaining laser treatments

## 2016-11-14 DIAGNOSIS — N920 Excessive and frequent menstruation with regular cycle: Secondary | ICD-10-CM | POA: Diagnosis not present

## 2016-11-14 DIAGNOSIS — Z3202 Encounter for pregnancy test, result negative: Secondary | ICD-10-CM | POA: Diagnosis not present

## 2016-11-27 DIAGNOSIS — Z01419 Encounter for gynecological examination (general) (routine) without abnormal findings: Secondary | ICD-10-CM | POA: Diagnosis not present

## 2016-12-17 ENCOUNTER — Ambulatory Visit: Payer: BLUE CROSS/BLUE SHIELD | Admitting: Podiatry

## 2016-12-17 DIAGNOSIS — B351 Tinea unguium: Secondary | ICD-10-CM

## 2016-12-20 DIAGNOSIS — R938 Abnormal findings on diagnostic imaging of other specified body structures: Secondary | ICD-10-CM | POA: Diagnosis not present

## 2016-12-20 DIAGNOSIS — N921 Excessive and frequent menstruation with irregular cycle: Secondary | ICD-10-CM | POA: Diagnosis not present

## 2016-12-20 DIAGNOSIS — N920 Excessive and frequent menstruation with regular cycle: Secondary | ICD-10-CM | POA: Diagnosis not present

## 2017-01-01 DIAGNOSIS — R938 Abnormal findings on diagnostic imaging of other specified body structures: Secondary | ICD-10-CM | POA: Diagnosis not present

## 2017-01-09 NOTE — Progress Notes (Signed)
Pt presents with mycotic infection of nails x 1 nail  All other systems are negative  Laser therapy administered to affected nails and tolerated well. All safety precautions were in place. Re-appointed in prn

## 2017-04-22 DIAGNOSIS — B078 Other viral warts: Secondary | ICD-10-CM | POA: Diagnosis not present

## 2017-04-22 DIAGNOSIS — L723 Sebaceous cyst: Secondary | ICD-10-CM | POA: Diagnosis not present

## 2017-04-22 DIAGNOSIS — D2239 Melanocytic nevi of other parts of face: Secondary | ICD-10-CM | POA: Diagnosis not present

## 2017-04-22 DIAGNOSIS — L821 Other seborrheic keratosis: Secondary | ICD-10-CM | POA: Diagnosis not present

## 2017-04-22 DIAGNOSIS — D235 Other benign neoplasm of skin of trunk: Secondary | ICD-10-CM | POA: Diagnosis not present

## 2017-05-23 DIAGNOSIS — B078 Other viral warts: Secondary | ICD-10-CM | POA: Diagnosis not present

## 2017-05-30 ENCOUNTER — Encounter: Payer: Self-pay | Admitting: Internal Medicine

## 2017-05-30 ENCOUNTER — Ambulatory Visit (INDEPENDENT_AMBULATORY_CARE_PROVIDER_SITE_OTHER): Payer: BLUE CROSS/BLUE SHIELD | Admitting: Internal Medicine

## 2017-05-30 DIAGNOSIS — Z0001 Encounter for general adult medical examination with abnormal findings: Secondary | ICD-10-CM

## 2017-05-30 DIAGNOSIS — J069 Acute upper respiratory infection, unspecified: Secondary | ICD-10-CM | POA: Insufficient documentation

## 2017-05-30 DIAGNOSIS — Z0000 Encounter for general adult medical examination without abnormal findings: Secondary | ICD-10-CM

## 2017-05-30 MED ORDER — VITAMIN D3 50 MCG (2000 UT) PO CAPS: 2000.0000 [IU] | ORAL_CAPSULE | Freq: Every day | ORAL | 3 refills | Status: DC

## 2017-05-30 NOTE — Assessment & Plan Note (Signed)
We discussed age appropriate health related issues, including available/recomended screening tests and vaccinations. We discussed a need for adhering to healthy diet and exercise. Labs were ordered to be later reviewed . All questions were answered.  PAP q 12 mo

## 2017-05-30 NOTE — Patient Instructions (Signed)
You can use over-the-counter  "cold" medicines  such as "Tylenol cold" , "Advil cold",  "Mucinex" or" Mucinex D"  for cough and congestion.   Avoid decongestants if you have high blood pressure and use "Afrin" nasal spray for nasal congestion as directed. Use " Delsym" or" Robitussin" cough syrup varietis for cough.  You can use plain "Tylenol" or "Advil" for fever, chills and achyness. Use Halls or Ricola cough drops.   "Common cold" symptoms are usually triggered by a virus.  The antibiotics are usually not necessary. On average, a" viral cold" illness would take 4-7 days to resolve.   Please, make an appointment if you are not better or if you're worse.  

## 2017-05-30 NOTE — Progress Notes (Signed)
Subjective:  Patient ID: Sue Watkins, female    DOB: 09/14/1970  Age: 46 y.o. MRN: [id number]  CC: No chief complaint on file.   HPI Sue Watkins presents for a well exam C/o URI sx's  Outpatient Medications Prior to Visit  Medication Sig Dispense Refill  . ranitidine (ZANTAC) 150 MG tablet Take 1 tablet (150 mg total) by mouth 2 (two) times daily. 180 tablet 3  . Calcium Carbonate Antacid (TUMS PO) Take by mouth.    . cefdinir (OMNICEF) 300 MG capsule Take 1 capsule (300 mg total) by mouth 2 (two) times daily. (Patient not taking: Reported on 11/07/2016) 20 capsule 0  . Cholecalciferol (VITAMIN D3) 2000 units capsule Take 1 capsule (2,000 Units total) by mouth daily. (Patient not taking: Reported on 11/07/2016) 100 capsule 3  . NONFORMULARY OR COMPOUNDED ITEM [facility]:  Onychomycosis Nail Lacquer - Fluconazole 2%, Terbinafine 1%, DMSO, apply to affected area daily. 120 each 2   No facility-administered medications prior to visit.     ROS Review of Systems  Constitutional: Negative for activity change, appetite change, chills, fatigue and unexpected weight change.  HENT: Positive for congestion, rhinorrhea, sinus pressure and sinus pain. Negative for mouth sores.   Eyes: Negative for visual disturbance.  Respiratory: Negative for cough and chest tightness.   Gastrointestinal: Negative for abdominal pain and nausea.  Genitourinary: Negative for difficulty urinating, frequency and vaginal pain.  Musculoskeletal: Negative for back pain and gait problem.  Skin: Negative for pallor and rash.  Neurological: Negative for dizziness, tremors, weakness, numbness and headaches.  Psychiatric/Behavioral: Negative for confusion and sleep disturbance.    Objective:  BP 116/74 (BP Location: Right Arm, Patient Position: Sitting, Cuff Size: Normal)   Pulse 87   Temp 98.7 F (37.1 C) (Oral)   Ht 5\' 1"  (1.549 m)   Wt 137 lb (62.1 kg)   SpO2 98%   BMI 25.89 kg/m   BP  Readings from Last 3 Encounters:  05/30/17 116/74  11/07/16 126/68  08/06/16 126/74    Wt Readings from Last 3 Encounters:  05/30/17 137 lb (62.1 kg)  11/07/16 145 lb (65.8 kg)  04/30/16 146 lb (66.2 kg)    Physical Exam  Constitutional: She appears well-developed. No distress.  HENT:  Head: Normocephalic.  Right Ear: External ear normal.  Left Ear: External ear normal.  Nose: Nose normal.  Mouth/Throat: Oropharynx is clear and moist.  Eyes: Conjunctivae are normal. Pupils are equal, round, and reactive to light. Right eye exhibits no discharge. Left eye exhibits no discharge.  Neck: Normal range of motion. Neck supple. No JVD present. No tracheal deviation present. No thyromegaly present.  Cardiovascular: Normal rate, regular rhythm and normal heart sounds.  Pulmonary/Chest: No stridor. No respiratory distress. She has no wheezes.  Abdominal: Soft. Bowel sounds are normal. She exhibits no distension and no mass. There is no tenderness. There is no rebound and no guarding.  Musculoskeletal: She exhibits no edema or tenderness.  Lymphadenopathy:    She has no cervical adenopathy.  Neurological: She displays normal reflexes. No cranial nerve deficit. She exhibits normal muscle tone. Coordination normal.  Skin: No rash noted. No erythema.  Psychiatric: She has a normal mood and affect. Her behavior is normal. Judgment and thought content normal.    Lab Results  Component Value Date   WBC 8.3 05/02/2016   HGB 13.2 05/02/2016   HCT 40.3 05/02/2016   PLT 203.0 05/02/2016   GLUCOSE 88 05/02/2016   CHOL  142 05/02/2016   TRIG 82.0 05/02/2016   HDL 45.00 05/02/2016   LDLCALC 81 05/02/2016   ALT 13 05/02/2016   AST 16 05/02/2016   NA 139 05/02/2016   K 4.2 05/02/2016   CL 105 05/02/2016   CREATININE 0.77 05/02/2016   BUN 15 05/02/2016   CO2 28 05/02/2016   TSH 2.45 05/02/2016    Dg Wrist Complete Left  Result Date: 11/07/2016 CLINICAL DATA:  Fall, left wrist pain and  swelling EXAM: LEFT WRIST - COMPLETE 3+ VIEW COMPARISON:  None available FINDINGS: There is no evidence of fracture or dislocation. There is no evidence of arthropathy or other focal bone abnormality. Soft tissues are unremarkable. IMPRESSION: Negative. Electronically Signed   By: Judie PetitM.  Shick M.D.   On: 11/07/2016 09:46    Assessment & Plan:   There are no diagnoses linked to this encounter. I have discontinued Sue Watkins's Vitamin D3, cefdinir, Calcium Carbonate Antacid (TUMS PO), and NONFORMULARY OR COMPOUNDED ITEM. I am also having her maintain her ranitidine.  No orders of the defined types were placed in this encounter.    Follow-up: No Follow-up on file.  Sonda PrimesAlex Plotnikov, MD

## 2017-11-22 ENCOUNTER — Encounter: Payer: Self-pay | Admitting: Family

## 2017-11-22 ENCOUNTER — Ambulatory Visit: Payer: BLUE CROSS/BLUE SHIELD | Admitting: Family

## 2017-11-22 VITALS — BP 118/74 | HR 75 | Temp 98.0°F | Ht 61.0 in | Wt 139.0 lb

## 2017-11-22 DIAGNOSIS — E049 Nontoxic goiter, unspecified: Secondary | ICD-10-CM

## 2017-11-22 NOTE — Progress Notes (Signed)
  Sue Watkins is a 47 y.o. female with the following history as recorded in [employer]:  Patient Active Problem List   Diagnosis Date Noted  . Upper respiratory infection 05/30/2017  . Left wrist pain 11/07/2016  . Well adult exam 04/30/2016  . Sinusitis, chronic 04/30/2016  . Anemia 04/30/2016  . Grief 04/30/2016  . GERD (gastroesophageal reflux disease) 04/23/2012  . Allergic rhinitis 04/23/2012  . Overweight (BMI 25.0-29.9) 04/23/2012    Current Outpatient Medications  Medication Sig Dispense Refill  . Cholecalciferol (VITAMIN D3) 2000 units capsule Take 1 capsule (2,000 Units total) by mouth daily. (Patient not taking: Reported on 11/22/2017) 100 capsule 3  . ranitidine (ZANTAC) 150 MG tablet Take 1 tablet (150 mg total) by mouth 2 (two) times daily. 180 tablet 3   No current facility-administered medications for this visit.     Allergies: Patient has no known allergies.  History reviewed. No pertinent past medical history.  History reviewed. No pertinent surgical history.  Family History  Problem Relation Age of Onset  . Hypertension Mother   . Thyroid cancer Mother   . Heart attack Mother [id number]       MI  . Lung cancer Father   . Uterine cancer Sister   . Stomach cancer Maternal Grandmother   . Diabetes Paternal Grandmother     Social History   Tobacco Use  . Smoking status: Never Smoker  . Smokeless tobacco: Never Used  Substance Use Topics  . Alcohol use: Yes    Subjective:  Patient presents with concerns for " swelling in her neck." Family history of thyroid cancer. Mother diagnosed with thyroid cancer in her 3270s; denies any problems swallowing; no unexplained weight gain- has noticed an area that "looks puffy" in her neck; needs to get her labs updated from her CPE and TSH is pending- planning to get her labs done early next week; denies any hair or skin changes; periods are stable;      Objective:  Vitals:   11/22/17 1017  BP: 118/74  Pulse: 75  Temp:  98 F (36.7 C)  TempSrc: Oral  SpO2: 98%  Weight: 139 lb (63 kg)  Height: 5\' 1"  (1.549 m)    General: Well developed, well nourished, in no acute distress  Skin : Warm and dry.  Head: Normocephalic and atraumatic  Eyes: Sclera and conjunctiva clear; pupils round and reactive to light; extraocular movements intact  Ears: External normal; canals clear; tympanic membranes normal  Oropharynx: Pink, supple. No suspicious lesions  Neck: Supple with mild thyromegaly, no adenopathy  Lungs: Respirations unlabored; clear to auscultation bilaterally without wheeze, rales, rhonchi  Neurologic: Alert and oriented; speech intact; face symmetrical; moves all extremities well; CNII-XII intact without focal deficit   Assessment:  1. Thyroid enlargement     Plan:  Update thyroid ultrasound; return for labs as discussed; follow-up to be determined;  No follow-ups on file.  Orders Placed This Encounter  Procedures  . US THYROID    Standing Status:   Future    Standing Expiration Date:   01/23/2019    Order Specific Question:   Reason for Exam (SYMPTOM  OR DIAGNOSIS REQUIRED)    Answer:   thyroid enlargement    Order Specific Question:   Preferred imaging location?    Answer:   [facility]    Requested Prescriptions    No prescriptions requested or ordered in this encounter

## 2017-11-25 ENCOUNTER — Other Ambulatory Visit (INDEPENDENT_AMBULATORY_CARE_PROVIDER_SITE_OTHER): Payer: BLUE CROSS/BLUE SHIELD

## 2017-11-25 DIAGNOSIS — J320 Chronic maxillary sinusitis: Secondary | ICD-10-CM

## 2017-11-25 DIAGNOSIS — Z0000 Encounter for general adult medical examination without abnormal findings: Secondary | ICD-10-CM

## 2017-11-25 LAB — CBC WITH DIFFERENTIAL/PLATELET
Basophils Absolute: 0 10*3/uL (ref 0.0–0.1)
Basophils Relative: 0.7 % (ref 0.0–3.0)
EOS ABS: 0.3 10*3/uL (ref 0.0–0.7)
EOS PCT: 4.2 % (ref 0.0–5.0)
HCT: 39.7 % (ref 36.0–46.0)
Hemoglobin: 13.6 g/dL (ref 12.0–15.0)
LYMPHS ABS: 1.6 10*3/uL (ref 0.7–4.0)
Lymphocytes Relative: 24.4 % (ref 12.0–46.0)
MCHC: 34.2 g/dL (ref 30.0–36.0)
MCV: 91.3 fl (ref 78.0–100.0)
MONO ABS: 0.8 10*3/uL (ref 0.1–1.0)
Monocytes Relative: 11.9 % (ref 3.0–12.0)
Neutro Abs: 3.8 10*3/uL (ref 1.4–7.7)
Neutrophils Relative %: 58.8 % (ref 43.0–77.0)
Platelets: 186 10*3/uL (ref 150.0–400.0)
RBC: 4.34 Mil/uL (ref 3.87–5.11)
RDW: 13.2 % (ref 11.5–15.5)
WBC: 6.5 10*3/uL (ref 4.0–10.5)

## 2017-11-25 LAB — BASIC METABOLIC PANEL
BUN: 14 mg/dL (ref 6–23)
CO2: 25 mEq/L (ref 19–32)
Calcium: 9.2 mg/dL (ref 8.4–10.5)
Chloride: 105 mEq/L (ref 96–112)
Creatinine, Ser: 0.73 mg/dL (ref 0.40–1.20)
GFR: 91.65 mL/min (ref >60.00)
GLUCOSE: 91 mg/dL (ref 70–99)
POTASSIUM: 3.9 meq/L (ref 3.5–5.1)
Sodium: 138 mEq/L (ref 135–145)

## 2017-11-25 LAB — URINALYSIS
Bilirubin Urine: NEGATIVE
Ketones, ur: NEGATIVE
Leukocytes, UA: NEGATIVE
NITRITE: NEGATIVE
Specific Gravity, Urine: >=1.03 — AB (ref 1.000–1.030)
Total Protein, Urine: NEGATIVE
UROBILINOGEN UA: 0.2 (ref 0.0–1.0)
Urine Glucose: NEGATIVE
pH: 5.5 (ref 5.0–8.0)

## 2017-11-25 LAB — LIPID PANEL
CHOLESTEROL: 147 mg/dL (ref 0–200)
HDL: 46.6 mg/dL (ref >39.00)
LDL Cholesterol: 86 mg/dL (ref 0–99)
NonHDL: 100.36
Total CHOL/HDL Ratio: 3
Triglycerides: 72 mg/dL (ref 0.0–149.0)
VLDL: 14.4 mg/dL (ref 0.0–40.0)

## 2017-11-25 LAB — IBC PANEL
Iron: 61 ug/dL (ref 42–145)
SATURATION RATIOS: 16 % — AB (ref 20.0–50.0)
TRANSFERRIN: 272 mg/dL (ref 212.0–360.0)

## 2017-11-25 LAB — TSH: TSH: 1.57 u[IU]/mL (ref 0.35–4.50)

## 2017-11-25 LAB — HEPATIC FUNCTION PANEL
ALT: 11 U/L (ref 0–35)
AST: 11 U/L (ref 0–37)
Albumin: 4.1 g/dL (ref 3.5–5.2)
Alkaline Phosphatase: 53 U/L (ref 39–117)
BILIRUBIN TOTAL: 0.4 mg/dL (ref 0.2–1.2)
Bilirubin, Direct: 0.1 mg/dL (ref 0.0–0.3)
Total Protein: 7.3 g/dL (ref 6.0–8.3)

## 2017-12-18 ENCOUNTER — Encounter: Payer: Self-pay | Admitting: Family

## 2018-04-04 DIAGNOSIS — Z01419 Encounter for gynecological examination (general) (routine) without abnormal findings: Secondary | ICD-10-CM | POA: Diagnosis not present

## 2018-04-04 DIAGNOSIS — Z6829 Body mass index (BMI) 29.0-29.9, adult: Secondary | ICD-10-CM | POA: Diagnosis not present

## 2018-04-04 DIAGNOSIS — Z1151 Encounter for screening for human papillomavirus (HPV): Secondary | ICD-10-CM | POA: Diagnosis not present

## 2018-04-04 DIAGNOSIS — Z3169 Encounter for other general counseling and advice on procreation: Secondary | ICD-10-CM | POA: Diagnosis not present

## 2018-04-04 DIAGNOSIS — Z1231 Encounter for screening mammogram for malignant neoplasm of breast: Secondary | ICD-10-CM | POA: Diagnosis not present

## 2018-05-07 DIAGNOSIS — R9389 Abnormal findings on diagnostic imaging of other specified body structures: Secondary | ICD-10-CM | POA: Diagnosis not present

## 2018-06-13 ENCOUNTER — Other Ambulatory Visit (INDEPENDENT_AMBULATORY_CARE_PROVIDER_SITE_OTHER): Payer: BLUE CROSS/BLUE SHIELD

## 2018-06-13 ENCOUNTER — Ambulatory Visit (INDEPENDENT_AMBULATORY_CARE_PROVIDER_SITE_OTHER): Payer: BLUE CROSS/BLUE SHIELD | Admitting: Internal Medicine

## 2018-06-13 ENCOUNTER — Other Ambulatory Visit: Payer: Self-pay | Admitting: Internal Medicine

## 2018-06-13 ENCOUNTER — Encounter: Payer: Self-pay | Admitting: Internal Medicine

## 2018-06-13 VITALS — BP 118/80 | HR 66 | Temp 98.2°F | Ht 61.0 in | Wt 136.0 lb

## 2018-06-13 DIAGNOSIS — Z0000 Encounter for general adult medical examination without abnormal findings: Secondary | ICD-10-CM

## 2018-06-13 DIAGNOSIS — E041 Nontoxic single thyroid nodule: Secondary | ICD-10-CM

## 2018-06-13 LAB — CBC WITH DIFFERENTIAL/PLATELET
Basophils Absolute: 0 10*3/uL (ref 0.0–0.1)
Basophils Relative: 0.4 % (ref 0.0–3.0)
Eosinophils Absolute: 0.2 10*3/uL (ref 0.0–0.7)
Eosinophils Relative: 3 % (ref 0.0–5.0)
HCT: 40.1 % (ref 36.0–46.0)
Hemoglobin: 13.6 g/dL (ref 12.0–15.0)
LYMPHS ABS: 2.3 10*3/uL (ref 0.7–4.0)
Lymphocytes Relative: 35.1 % (ref 12.0–46.0)
MCHC: 34 g/dL (ref 30.0–36.0)
MCV: 90.6 fl (ref 78.0–100.0)
Monocytes Absolute: 0.5 10*3/uL (ref 0.1–1.0)
Monocytes Relative: 8.2 % (ref 3.0–12.0)
Neutro Abs: 3.5 10*3/uL (ref 1.4–7.7)
Neutrophils Relative %: 53.3 % (ref 43.0–77.0)
Platelets: 210 10*3/uL (ref 150.0–400.0)
RBC: 4.43 Mil/uL (ref 3.87–5.11)
RDW: 13 % (ref 11.5–15.5)
WBC: 6.5 10*3/uL (ref 4.0–10.5)

## 2018-06-13 LAB — LIPID PANEL
Cholesterol: 150 mg/dL (ref 0–200)
HDL: 48.2 mg/dL (ref >39.00)
LDL Cholesterol: 89 mg/dL (ref 0–99)
NONHDL: 101.51
Total CHOL/HDL Ratio: 3
Triglycerides: 64 mg/dL (ref 0.0–149.0)
VLDL: 12.8 mg/dL (ref 0.0–40.0)

## 2018-06-13 LAB — URINALYSIS, ROUTINE W REFLEX MICROSCOPIC
Bilirubin Urine: NEGATIVE
Ketones, ur: NEGATIVE
Leukocytes, UA: NEGATIVE
NITRITE: NEGATIVE
Specific Gravity, Urine: 1.02 (ref 1.000–1.030)
TOTAL PROTEIN, URINE-UPE24: NEGATIVE
Urine Glucose: NEGATIVE
Urobilinogen, UA: 0.2 (ref 0.0–1.0)
WBC, UA: NONE SEEN (ref 0–?)
pH: 6 (ref 5.0–8.0)

## 2018-06-13 LAB — TSH: TSH: 1.65 u[IU]/mL (ref 0.35–4.50)

## 2018-06-13 LAB — BASIC METABOLIC PANEL
BUN: 14 mg/dL (ref 6–23)
CO2: 26 mEq/L (ref 19–32)
Calcium: 9.3 mg/dL (ref 8.4–10.5)
Chloride: 106 mEq/L (ref 96–112)
Creatinine, Ser: 0.71 mg/dL (ref 0.40–1.20)
GFR: 94.4 mL/min (ref >60.00)
GLUCOSE: 93 mg/dL (ref 70–99)
Potassium: 4 mEq/L (ref 3.5–5.1)
Sodium: 139 mEq/L (ref 135–145)

## 2018-06-13 LAB — HEPATIC FUNCTION PANEL
ALT: 11 U/L (ref 0–35)
AST: 13 U/L (ref 0–37)
Albumin: 4.2 g/dL (ref 3.5–5.2)
Alkaline Phosphatase: 49 U/L (ref 39–117)
BILIRUBIN TOTAL: 0.4 mg/dL (ref 0.2–1.2)
Bilirubin, Direct: 0.1 mg/dL (ref 0.0–0.3)
Total Protein: 7.4 g/dL (ref 6.0–8.3)

## 2018-06-13 MED ORDER — VITAMIN D3 50 MCG (2000 UT) PO CAPS: 2000.0000 [IU] | ORAL_CAPSULE | Freq: Every day | ORAL | 3 refills | Status: DC

## 2018-06-13 NOTE — Assessment & Plan Note (Addendum)
We discussed age appropriate health related issues, including available/recomended screening tests and vaccinations. We discussed a need for adhering to healthy diet and exercise. Labs were ordered to be later reviewed . All questions were answered. tDAP 2015? GYN q12 mo Ophth q 1-2 years

## 2018-06-13 NOTE — Assessment & Plan Note (Signed)
US Labs  

## 2018-06-13 NOTE — Progress Notes (Signed)
Subjective:  Patient ID: Sue Watkins, female    DOB: 10/12/1970  Age: 47 y.o. MRN: [id number]  CC: No chief complaint on file.   HPI Sue Watkins presents for a well exam  Outpatient Medications Prior to Visit  Medication Sig Dispense Refill  . Cholecalciferol (VITAMIN D3) 2000 units capsule Take 1 capsule (2,000 Units total) by mouth daily. (Patient not taking: Reported on 11/22/2017) 100 capsule 3  . ranitidine (ZANTAC) 150 MG tablet Take 1 tablet (150 mg total) by mouth 2 (two) times daily. 180 tablet 3   No facility-administered medications prior to visit.     ROS: Review of Systems  Constitutional: Negative for activity change, appetite change, chills, fatigue and unexpected weight change.  HENT: Negative for congestion, mouth sores and sinus pressure.   Eyes: Negative for visual disturbance.  Respiratory: Negative for cough and chest tightness.   Gastrointestinal: Negative for abdominal pain and nausea.  Genitourinary: Negative for difficulty urinating, frequency and vaginal pain.  Musculoskeletal: Negative for back pain and gait problem.  Skin: Negative for pallor and rash.  Neurological: Negative for dizziness, tremors, weakness, numbness and headaches.  Psychiatric/Behavioral: Negative for confusion and sleep disturbance.    Objective:  BP 118/80 (BP Location: Left Arm, Patient Position: Sitting, Cuff Size: Normal)   Pulse 66   Temp 98.2 F (36.8 C) (Oral)   Ht 5\' 1"  (1.549 m)   Wt 136 lb (61.7 kg)   SpO2 99%   BMI 25.70 kg/m   BP Readings from Last 3 Encounters:  06/13/18 118/80  11/22/17 118/74  05/30/17 116/74    Wt Readings from Last 3 Encounters:  06/13/18 136 lb (61.7 kg)  11/22/17 139 lb (63 kg)  05/30/17 137 lb (62.1 kg)    Physical Exam Constitutional:      General: She is not in acute distress.    Appearance: She is well-developed.  HENT:     Head: Normocephalic.     Right Ear: External ear normal.     Left Ear: External  ear normal.     Nose: Nose normal.  Eyes:     General:        Right eye: No discharge.        Left eye: No discharge.     Conjunctiva/sclera: Conjunctivae normal.     Pupils: Pupils are equal, round, and reactive to light.  Neck:     Musculoskeletal: Normal range of motion and neck supple.     Thyroid: No thyromegaly.     Vascular: No JVD.     Trachea: No tracheal deviation.  Cardiovascular:     Rate and Rhythm: Normal rate and regular rhythm.     Heart sounds: Normal heart sounds.  Pulmonary:     Effort: No respiratory distress.     Breath sounds: No stridor. No wheezing.  Abdominal:     General: Bowel sounds are normal. There is no distension.     Palpations: Abdomen is soft. There is no mass.     Tenderness: There is no abdominal tenderness. There is no guarding or rebound.  Musculoskeletal:        General: No tenderness.  Lymphadenopathy:     Cervical: No cervical adenopathy.  Skin:    Findings: No erythema or rash.  Neurological:     Cranial Nerves: No cranial nerve deficit.     Motor: No abnormal muscle tone.     Coordination: Coordination normal.     Deep Tendon Reflexes: Reflexes normal.  Psychiatric:        Behavior: Behavior normal.        Thought Content: Thought content normal.        Judgment: Judgment normal.   ?L thyroid nodule  Lab Results  Component Value Date   WBC 6.5 11/25/2017   HGB 13.6 11/25/2017   HCT 39.7 11/25/2017   PLT 186.0 11/25/2017   GLUCOSE 91 11/25/2017   CHOL 147 11/25/2017   TRIG 72.0 11/25/2017   HDL 46.60 11/25/2017   LDLCALC 86 11/25/2017   ALT 11 11/25/2017   AST 11 11/25/2017   NA 138 11/25/2017   K 3.9 11/25/2017   CL 105 11/25/2017   CREATININE 0.73 11/25/2017   BUN 14 11/25/2017   CO2 25 11/25/2017   TSH 1.57 11/25/2017    Dg Wrist Complete Left  Result Date: 11/07/2016 CLINICAL DATA:  Fall, left wrist pain and swelling EXAM: LEFT WRIST - COMPLETE 3+ VIEW COMPARISON:  None available FINDINGS: There is no  evidence of fracture or dislocation. There is no evidence of arthropathy or other focal bone abnormality. Soft tissues are unremarkable. IMPRESSION: Negative. Electronically Signed   By: Judie PetitM.  Shick M.D.   On: 11/07/2016 09:46    Assessment & Plan:   There are no diagnoses linked to this encounter.   No orders of the defined types were placed in this encounter.    Follow-up: No follow-ups on file.  Sonda PrimesAlex Enzo Treu, MD

## 2018-06-16 ENCOUNTER — Encounter: Payer: Self-pay | Admitting: Internal Medicine

## 2018-07-02 ENCOUNTER — Ambulatory Visit
Admission: RE | Admit: 2018-07-02 | Discharge: 2018-07-02 | Disposition: A | Discharge status: Home / Self Care | Payer: BLUE CROSS/BLUE SHIELD | Source: Ambulatory Visit | Attending: Internal Medicine | Admitting: Internal Medicine

## 2018-07-02 DIAGNOSIS — E049 Nontoxic goiter, unspecified: Secondary | ICD-10-CM | POA: Diagnosis not present

## 2019-01-27 DIAGNOSIS — Z03818 Encounter for observation for suspected exposure to other biological agents ruled out: Secondary | ICD-10-CM | POA: Diagnosis not present

## 2019-03-23 IMAGING — DX DG WRIST COMPLETE 3+V*L*
4 series · 4 of 4 positions shown · non-contrast
Comparison: None available

CLINICAL DATA: Fall, left wrist pain and swelling

EXAM:
LEFT WRIST - COMPLETE 3+ VIEW

[wrist pa]
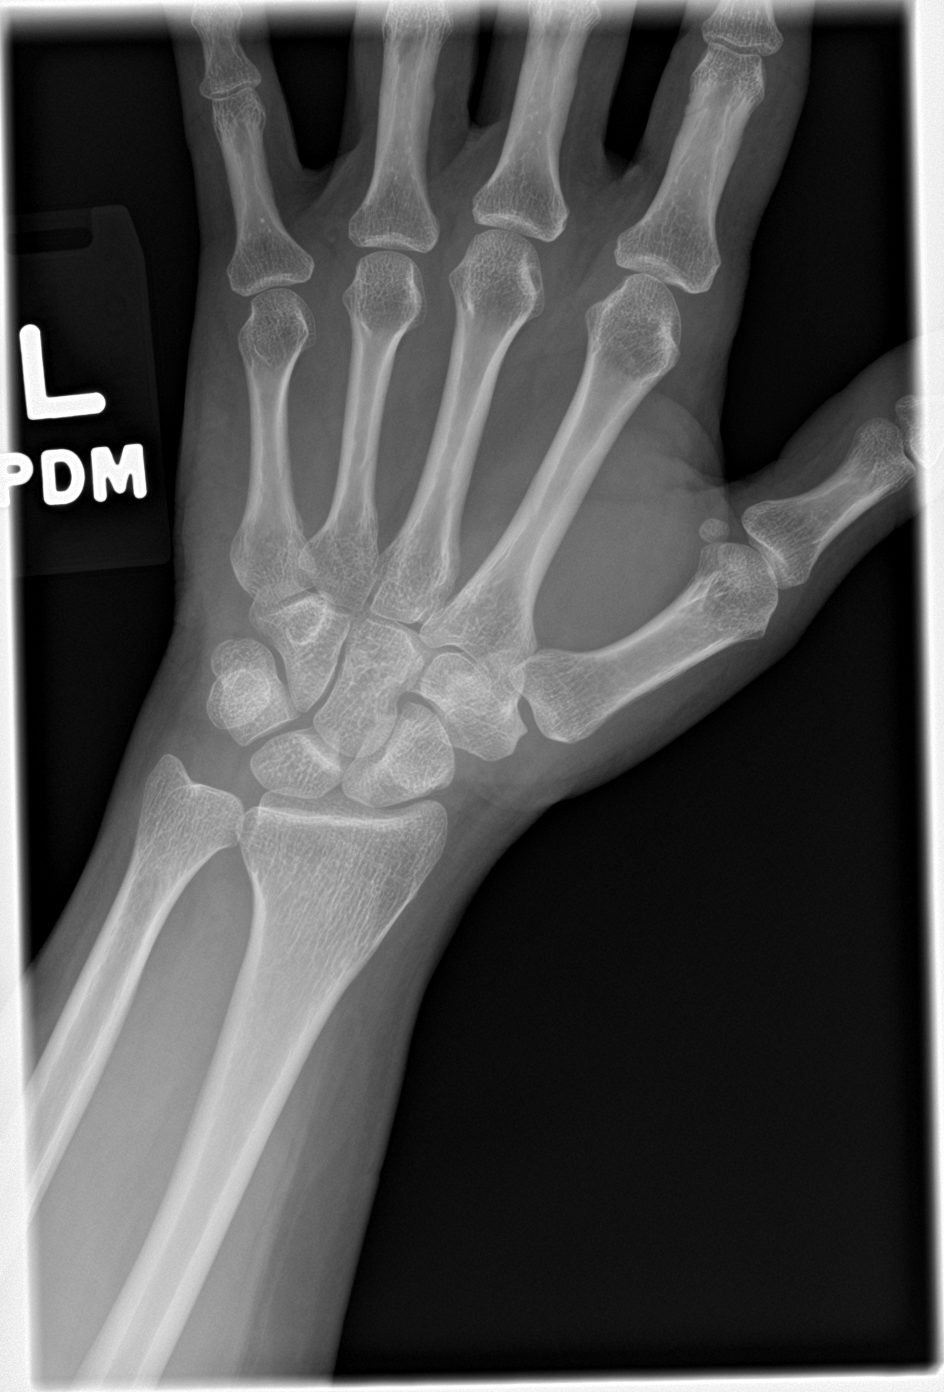

[wrist obl]
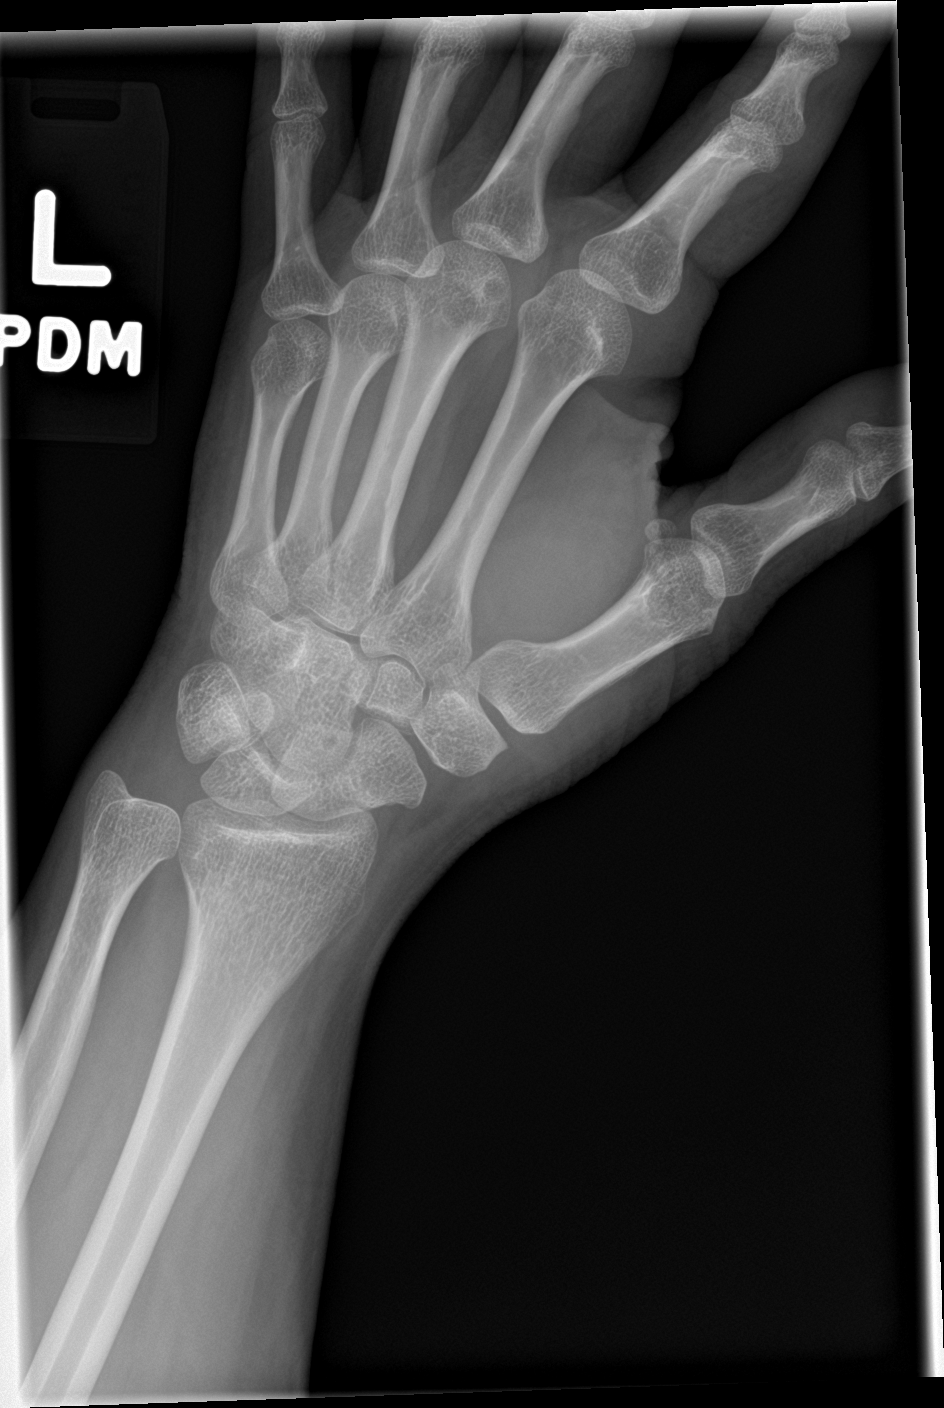

[wrist lat]
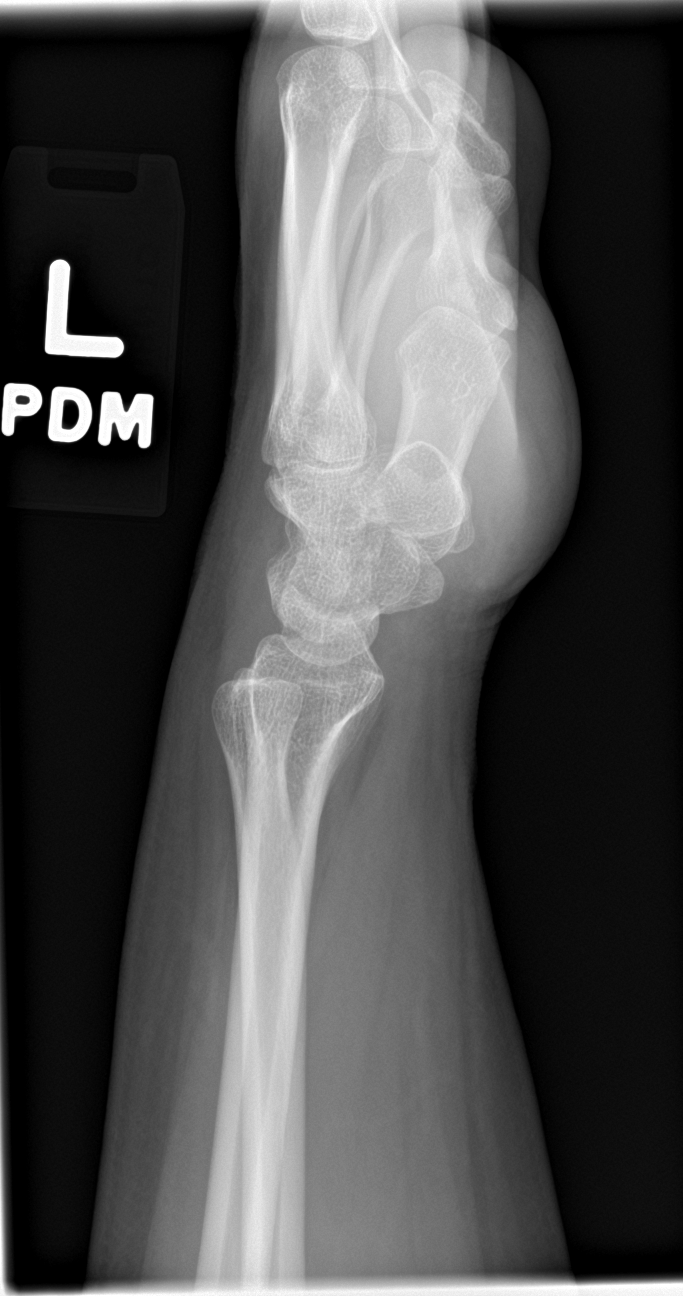

[navicular]
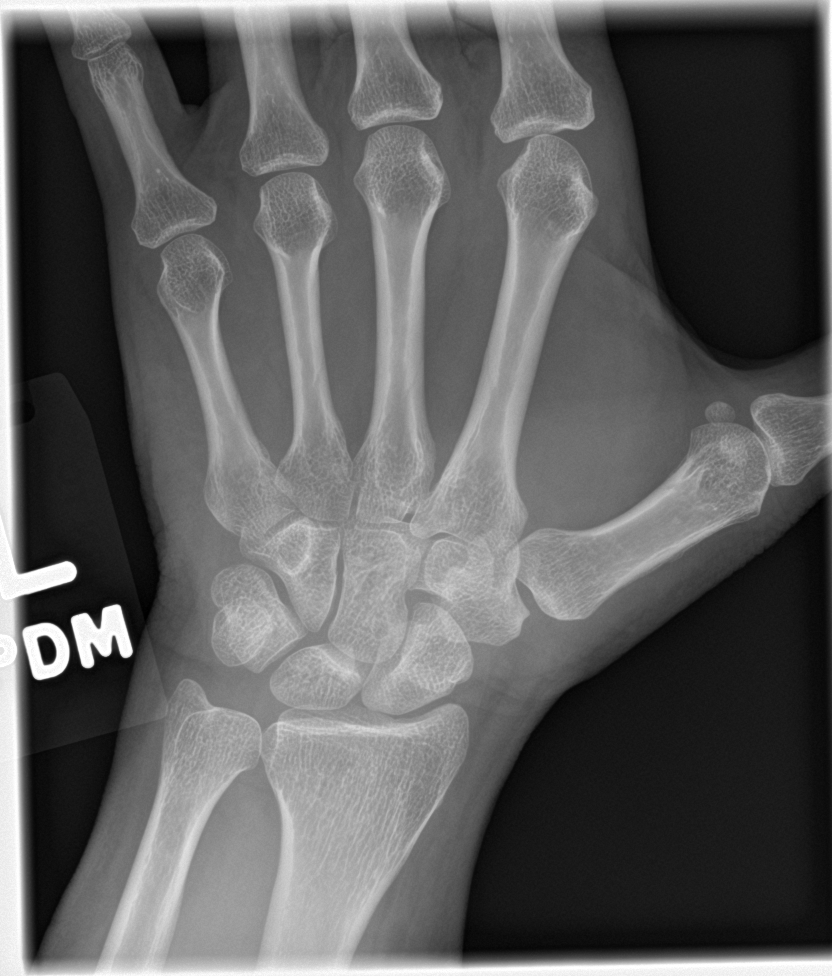

[4 of 4 positions shown; findings below may reference images not displayed]

FINDINGS: There is no evidence of fracture or dislocation. There is no
evidence of arthropathy or other focal bone abnormality. Soft
tissues are unremarkable.
IMPRESSION: Negative.

## 2019-04-02 ENCOUNTER — Other Ambulatory Visit: Payer: Self-pay

## 2019-04-02 DIAGNOSIS — Z20828 Contact with and (suspected) exposure to other viral communicable diseases: Secondary | ICD-10-CM | POA: Diagnosis not present

## 2019-04-02 DIAGNOSIS — Z20822 Contact with and (suspected) exposure to covid-19: Secondary | ICD-10-CM

## 2019-04-03 LAB — NOVEL CORONAVIRUS, NAA: SARS-CoV-2, NAA: NOT DETECTED

## 2019-04-15 ENCOUNTER — Ambulatory Visit (INDEPENDENT_AMBULATORY_CARE_PROVIDER_SITE_OTHER): Payer: BC Managed Care – PPO | Admitting: Internal Medicine

## 2019-04-15 ENCOUNTER — Encounter: Payer: Self-pay | Admitting: Internal Medicine

## 2019-04-15 DIAGNOSIS — J320 Chronic maxillary sinusitis: Secondary | ICD-10-CM | POA: Diagnosis not present

## 2019-04-15 DIAGNOSIS — Z20828 Contact with and (suspected) exposure to other viral communicable diseases: Secondary | ICD-10-CM

## 2019-04-15 DIAGNOSIS — J069 Acute upper respiratory infection, unspecified: Secondary | ICD-10-CM

## 2019-04-15 DIAGNOSIS — Z20822 Contact with and (suspected) exposure to covid-19: Secondary | ICD-10-CM

## 2019-04-15 MED ORDER — AZITHROMYCIN 250 MG PO TABS: ORAL_TABLET | ORAL | 0 refills | Status: DC

## 2019-04-15 MED ORDER — ALBUTEROL SULFATE HFA 108 (90 BASE) MCG/ACT IN AERS: 2.0000 | INHALATION_SPRAY | Freq: Four times a day (QID) | RESPIRATORY_TRACT | 2 refills | Status: DC | PRN

## 2019-04-15 NOTE — Assessment & Plan Note (Signed)
Mild to mod, for antibx course, albuterol HFA prn, and repeat Covid testing referral,  to f/u any worsening symptoms or concerns

## 2019-04-15 NOTE — Patient Instructions (Signed)
Please take all new medication as prescribed  Please go for the COVID testing tomorrow  Please continue all other medications as before, and refills have been done if requested.  Please have the pharmacy call with any other refills you may need.  Please continue your efforts at being more active, low cholesterol diet, and weight control.  Please keep your appointments with your specialists as you may have planned   

## 2019-04-15 NOTE — Progress Notes (Signed)
Patient ID: Sue Watkins, female   DOB: 1970/10/05, 48 y.o.   MRN: [id number]  Virtual Visit via Video Note  I connected with Sue Watkins on 04/15/19 at  3:40 PM EDT by a video enabled telemedicine application and verified that I am speaking with the correct person using two identifiers.  Location: Patient: at home Provider: at office   I discussed the limitations of evaluation and management by telemedicine and the availability of in person appointments. The patient expressed understanding and agreed to proceed.  History of Present Illness: Here with acute onset 2 wks fatigue, low energy, head congestion, ST hard to swallow, no cough but has mild increased sob with difficulty taking deep breaths.  Not sure about fever, but just doesn't feel good.  Did have covid negative about 2 wks ago.  Pt denies chest pain, wheezing, orthopnea, PND, increased LE swelling, palpitations, dizziness or syncope.   Pt denies new neurological symptoms such as new headache, or facial or extremity weakness or numbness   Pt denies polydipsia, polyuria No past medical history on file. No past surgical history on file.  reports that she has never smoked. She has never used smokeless tobacco. She reports current alcohol use. She reports that she does not use drugs. family history includes Diabetes in her paternal grandmother; Heart attack (age of onset: 67) in her mother; Hypertension in her mother; Lung cancer in her father; Stomach cancer in her maternal grandmother; Thyroid cancer in her mother; Uterine cancer in her sister. No Known Allergies Current Outpatient Medications on File Prior to Visit  Medication Sig Dispense Refill  . Cholecalciferol (VITAMIN D3) 50 MCG (2000 UT) capsule Take 1 capsule (2,000 Units total) by mouth daily. 100 capsule 3   No current facility-administered medications on file prior to visit.     Observations/Objective: Alert, NAD, appropriate mood and affect, resps normal, cn  2-12 intact, moves all 4s, no visible rash or swelling Lab Results  Component Value Date   WBC 6.5 06/13/2018   HGB 13.6 06/13/2018   HCT 40.1 06/13/2018   PLT 210.0 06/13/2018   GLUCOSE 93 06/13/2018   CHOL 150 06/13/2018   TRIG 64.0 06/13/2018   HDL 48.20 06/13/2018   LDLCALC 89 06/13/2018   ALT 11 06/13/2018   AST 13 06/13/2018   NA 139 06/13/2018   K 4.0 06/13/2018   CL 106 06/13/2018   CREATININE 0.71 06/13/2018   BUN 14 06/13/2018   CO2 26 06/13/2018   TSH 1.65 06/13/2018   Assessment and Plan: See notes  Follow Up Instructions: See notes   I discussed the assessment and treatment plan with the patient. The patient was provided an opportunity to ask questions and all were answered. The patient agreed with the plan and demonstrated an understanding of the instructions.   The patient was advised to call back or seek an in-person evaluation if the symptoms worsen or if the condition fails to improve as anticipated   Cathlean Cower, MD

## 2019-04-15 NOTE — Assessment & Plan Note (Signed)
stable overall by history and exam, recent data reviewed with pt, and pt to continue medical treatment as before,  to f/u any worsening symptoms or concerns  

## 2019-04-16 ENCOUNTER — Ambulatory Visit: Payer: BLUE CROSS/BLUE SHIELD

## 2019-04-21 DIAGNOSIS — Z03818 Encounter for observation for suspected exposure to other biological agents ruled out: Secondary | ICD-10-CM | POA: Diagnosis not present

## 2019-06-09 DIAGNOSIS — Z6827 Body mass index (BMI) 27.0-27.9, adult: Secondary | ICD-10-CM | POA: Diagnosis not present

## 2019-06-09 DIAGNOSIS — Z01419 Encounter for gynecological examination (general) (routine) without abnormal findings: Secondary | ICD-10-CM | POA: Diagnosis not present

## 2019-06-11 ENCOUNTER — Encounter: Payer: BC Managed Care – PPO | Admitting: Internal Medicine

## 2019-06-15 ENCOUNTER — Ambulatory Visit (INDEPENDENT_AMBULATORY_CARE_PROVIDER_SITE_OTHER): Payer: BC Managed Care – PPO | Admitting: Internal Medicine

## 2019-06-15 ENCOUNTER — Encounter: Payer: Self-pay | Admitting: Internal Medicine

## 2019-06-15 ENCOUNTER — Other Ambulatory Visit: Payer: Self-pay

## 2019-06-15 VITALS — BP 118/82 | HR 73 | Temp 98.6°F | Ht 61.0 in | Wt 147.0 lb

## 2019-06-15 DIAGNOSIS — Z0000 Encounter for general adult medical examination without abnormal findings: Secondary | ICD-10-CM | POA: Diagnosis not present

## 2019-06-15 DIAGNOSIS — Z23 Encounter for immunization: Secondary | ICD-10-CM

## 2019-06-15 DIAGNOSIS — J399 Disease of upper respiratory tract, unspecified: Secondary | ICD-10-CM

## 2019-06-15 MED ORDER — CEFUROXIME AXETIL 250 MG PO TABS: 250.0000 mg | ORAL_TABLET | Freq: Two times a day (BID) | ORAL | 0 refills | Status: DC

## 2019-06-15 MED ORDER — FLUTICASONE PROPIONATE 50 MCG/ACT NA SUSP: 2.0000 | Freq: Every day | NASAL | 1 refills | Status: DC

## 2019-06-15 MED ORDER — PSEUDOEPHEDRINE HCL ER 120 MG PO TB12: 120.0000 mg | ORAL_TABLET | Freq: Two times a day (BID) | ORAL | 1 refills | Status: AC | PRN

## 2019-06-15 NOTE — Assessment & Plan Note (Signed)
We discussed age appropriate health related issues, including available/recomended screening tests and vaccinations. We discussed a need for adhering to healthy diet and exercise. Labs were ordered to be later reviewed . All questions were answered.   

## 2019-06-15 NOTE — Progress Notes (Signed)
Subjective:  Patient ID: Sue Watkins, female    DOB: 1971-01-17  Age: 48 y.o. MRN: [id number]  CC: No chief complaint on file.   HPI SUDIE BANDEL presents for a well exam H/o URI in Oct 2020  Outpatient Medications Prior to Visit  Medication Sig Dispense Refill  . Cholecalciferol (VITAMIN D3) 50 MCG (2000 UT) capsule Take 1 capsule (2,000 Units total) by mouth daily. 100 capsule 3  . albuterol (VENTOLIN HFA) 108 (90 Base) MCG/ACT inhaler Inhale 2 puffs into the lungs every 6 (six) hours as needed for wheezing or shortness of breath. 18 g 2  . azithromycin (ZITHROMAX) 250 MG tablet 2 tab by mouth day 1, then 1 per day 6 tablet 0   No facility-administered medications prior to visit.    ROS: Review of Systems  Constitutional: Negative for activity change, appetite change, chills, fatigue and unexpected weight change.  HENT: Negative for congestion, mouth sores and sinus pressure.   Eyes: Negative for visual disturbance.  Respiratory: Negative for cough and chest tightness.   Gastrointestinal: Negative for abdominal pain and nausea.  Genitourinary: Negative for difficulty urinating, frequency and vaginal pain.  Musculoskeletal: Negative for back pain and gait problem.  Skin: Negative for pallor and rash.  Neurological: Negative for dizziness, tremors, weakness, numbness and headaches.  Psychiatric/Behavioral: Negative for confusion and sleep disturbance. The patient is nervous/anxious.     Objective:  BP 118/82 (BP Location: Left Arm, Patient Position: Sitting, Cuff Size: Normal)   Pulse 73   Temp 98.6 F (37 C) (Oral)   Ht 5\' 1"  (1.549 m)   Wt 147 lb (66.7 kg)   SpO2 98%   BMI 27.78 kg/m   BP Readings from Last 3 Encounters:  06/15/19 118/82  06/13/18 118/80  11/22/17 118/74    Wt Readings from Last 3 Encounters:  06/15/19 147 lb (66.7 kg)  06/13/18 136 lb (61.7 kg)  11/22/17 139 lb (63 kg)    Physical Exam Constitutional:      General: She is  not in acute distress.    Appearance: She is well-developed.  HENT:     Head: Normocephalic.     Right Ear: External ear normal.     Left Ear: External ear normal.     Nose: Nose normal.  Eyes:     General:        Right eye: No discharge.        Left eye: No discharge.     Conjunctiva/sclera: Conjunctivae normal.     Pupils: Pupils are equal, round, and reactive to light.  Neck:     Thyroid: No thyromegaly.     Vascular: No JVD.     Trachea: No tracheal deviation.  Cardiovascular:     Rate and Rhythm: Normal rate and regular rhythm.     Heart sounds: Normal heart sounds.  Pulmonary:     Effort: No respiratory distress.     Breath sounds: No stridor. No wheezing.  Abdominal:     General: Bowel sounds are normal. There is no distension.     Palpations: Abdomen is soft. There is no mass.     Tenderness: There is no abdominal tenderness. There is no guarding or rebound.  Musculoskeletal:        General: No tenderness.     Cervical back: Normal range of motion and neck supple.  Lymphadenopathy:     Cervical: No cervical adenopathy.  Skin:    Findings: No erythema or rash.  Neurological:  Mental Status: She is oriented to person, place, and time.     Cranial Nerves: No cranial nerve deficit.     Motor: No abnormal muscle tone.     Coordination: Coordination normal.     Deep Tendon Reflexes: Reflexes normal.  Psychiatric:        Behavior: Behavior normal.        Thought Content: Thought content normal.        Judgment: Judgment normal.   TM w/fluid  Lab Results  Component Value Date   WBC 6.5 06/13/2018   HGB 13.6 06/13/2018   HCT 40.1 06/13/2018   PLT 210.0 06/13/2018   GLUCOSE 93 06/13/2018   CHOL 150 06/13/2018   TRIG 64.0 06/13/2018   HDL 48.20 06/13/2018   LDLCALC 89 06/13/2018   ALT 11 06/13/2018   AST 13 06/13/2018   NA 139 06/13/2018   K 4.0 06/13/2018   CL 106 06/13/2018   CREATININE 0.71 06/13/2018   BUN 14 06/13/2018   CO2 26 06/13/2018   TSH  1.65 06/13/2018    US THYROID  Result Date: 07/02/2018 CLINICAL DATA:  Enlarged thyroid on exam EXAM: THYROID ULTRASOUND TECHNIQUE: Ultrasound examination of the thyroid gland and adjacent soft tissues was performed. COMPARISON:  None. FINDINGS: Parenchymal Echotexture: Normal Isthmus: 2 mm Right lobe: 4.7 x 1.2 x 2.0 cm Left lobe: 5.1 x 1.5 x 1.7 cm _________________________________________________________ Estimated total number of nodules >/= 1 cm: 0 Number of spongiform nodules >/=  2 cm not described below (TR1): 0 Number of mixed cystic and solid nodules >/= 1.5 cm not described below (TR2): 0 _________________________________________________________ No discrete nodules are seen within the thyroid gland. IMPRESSION: Normal thyroid ultrasound for age The above is in keeping with the ACR TI-RADS recommendations - J Am Coll Radiol 2017;14:587-595. Electronically Signed   By: Judie Petit.  Shick M.D.   On: 07/02/2018 17:10    Assessment & Plan:   There are no diagnoses linked to this encounter.   No orders of the defined types were placed in this encounter.    Follow-up: No follow-ups on file.  Sonda Primes, MD

## 2019-06-16 ENCOUNTER — Other Ambulatory Visit (INDEPENDENT_AMBULATORY_CARE_PROVIDER_SITE_OTHER): Payer: BC Managed Care – PPO

## 2019-06-16 DIAGNOSIS — J399 Disease of upper respiratory tract, unspecified: Secondary | ICD-10-CM

## 2019-06-16 DIAGNOSIS — Z0000 Encounter for general adult medical examination without abnormal findings: Secondary | ICD-10-CM | POA: Diagnosis not present

## 2019-06-16 LAB — HEPATIC FUNCTION PANEL
ALT: 17 U/L (ref 0–35)
AST: 14 U/L (ref 0–37)
Albumin: 4.1 g/dL (ref 3.5–5.2)
Alkaline Phosphatase: 50 U/L (ref 39–117)
Bilirubin, Direct: 0.1 mg/dL (ref 0.0–0.3)
Total Bilirubin: 0.4 mg/dL (ref 0.2–1.2)
Total Protein: 6.8 g/dL (ref 6.0–8.3)

## 2019-06-16 LAB — URINALYSIS, ROUTINE W REFLEX MICROSCOPIC
Bilirubin Urine: NEGATIVE
Hgb urine dipstick: NEGATIVE
Ketones, ur: NEGATIVE
Nitrite: NEGATIVE
RBC / HPF: NONE SEEN (ref 0–?)
Specific Gravity, Urine: 1.025 (ref 1.000–1.030)
Total Protein, Urine: NEGATIVE
Urine Glucose: NEGATIVE
Urobilinogen, UA: 0.2 (ref 0.0–1.0)
pH: 5.5 (ref 5.0–8.0)

## 2019-06-16 LAB — LIPID PANEL
Cholesterol: 153 mg/dL (ref 0–200)
HDL: 45.6 mg/dL (ref >39.00)
LDL Cholesterol: 94 mg/dL (ref 0–99)
NonHDL: 107.68
Total CHOL/HDL Ratio: 3
Triglycerides: 68 mg/dL (ref 0.0–149.0)
VLDL: 13.6 mg/dL (ref 0.0–40.0)

## 2019-06-16 LAB — BASIC METABOLIC PANEL
BUN: 13 mg/dL (ref 6–23)
CO2: 26 mEq/L (ref 19–32)
Calcium: 9.2 mg/dL (ref 8.4–10.5)
Chloride: 102 mEq/L (ref 96–112)
Creatinine, Ser: 0.67 mg/dL (ref 0.40–1.20)
GFR: 94.54 mL/min (ref >60.00)
Glucose, Bld: 95 mg/dL (ref 70–99)
Potassium: 3.8 mEq/L (ref 3.5–5.1)
Sodium: 135 mEq/L (ref 135–145)

## 2019-06-16 LAB — CBC WITH DIFFERENTIAL/PLATELET
Basophils Absolute: 0 10*3/uL (ref 0.0–0.1)
Basophils Relative: 0.3 % (ref 0.0–3.0)
Eosinophils Absolute: 0.2 10*3/uL (ref 0.0–0.7)
Eosinophils Relative: 2.4 % (ref 0.0–5.0)
HCT: 40.6 % (ref 36.0–46.0)
Hemoglobin: 13.7 g/dL (ref 12.0–15.0)
Lymphocytes Relative: 23.5 % (ref 12.0–46.0)
Lymphs Abs: 2.1 10*3/uL (ref 0.7–4.0)
MCHC: 33.9 g/dL (ref 30.0–36.0)
MCV: 91.9 fl (ref 78.0–100.0)
Monocytes Absolute: 0.7 10*3/uL (ref 0.1–1.0)
Monocytes Relative: 7.9 % (ref 3.0–12.0)
Neutro Abs: 5.8 10*3/uL (ref 1.4–7.7)
Neutrophils Relative %: 65.9 % (ref 43.0–77.0)
Platelets: 185 10*3/uL (ref 150.0–400.0)
RBC: 4.41 Mil/uL (ref 3.87–5.11)
RDW: 13.2 % (ref 11.5–15.5)
WBC: 8.8 10*3/uL (ref 4.0–10.5)

## 2019-06-16 LAB — TSH: TSH: 1.74 u[IU]/mL (ref 0.35–4.50)

## 2019-06-17 LAB — SAR COV2 SEROLOGY (COVID19)AB(IGG),IA: SARS CoV2 AB IGG: NEGATIVE

## 2019-06-30 ENCOUNTER — Other Ambulatory Visit: Payer: Self-pay | Admitting: Internal Medicine

## 2019-06-30 DIAGNOSIS — E041 Nontoxic single thyroid nodule: Secondary | ICD-10-CM

## 2019-06-30 DIAGNOSIS — H9319 Tinnitus, unspecified ear: Secondary | ICD-10-CM

## 2019-07-08 NOTE — Telephone Encounter (Signed)
Pt is requesting status on her referral to ENT. Will you guys pls contact pt w/response.Marland KitchenRaechel Chute

## 2019-07-28 DIAGNOSIS — H9312 Tinnitus, left ear: Secondary | ICD-10-CM | POA: Diagnosis not present

## 2019-08-05 DIAGNOSIS — Z20828 Contact with and (suspected) exposure to other viral communicable diseases: Secondary | ICD-10-CM | POA: Diagnosis not present

## 2019-08-05 DIAGNOSIS — Z7189 Other specified counseling: Secondary | ICD-10-CM | POA: Diagnosis not present

## 2019-11-03 ENCOUNTER — Ambulatory Visit: Payer: Self-pay | Attending: Internal Medicine

## 2019-11-03 DIAGNOSIS — Z23 Encounter for immunization: Secondary | ICD-10-CM

## 2019-11-03 NOTE — Progress Notes (Signed)
   Covid-19 Vaccination Clinic  Name:  Sue Watkins    MRN: [id number] DOB: 10-29-1970  11/03/2019  [name] was observed post Covid-19 immunization for 15 minutes without incident. She was provided with Vaccine Information Sheet and instruction to access the V-Safe system.   [name] was instructed to call 911 with any severe reactions post vaccine: Marland Kitchen Difficulty breathing  . Swelling of face and throat  . A fast heartbeat  . A bad rash all over body  . Dizziness and weakness   Immunizations Administered    Name Date Dose VIS Date Route   Pfizer COVID-19 Vaccine 11/03/2019 10:10 AM 0.3 mL 08/19/2018 Intramuscular   Manufacturer: [employer], [employer]   Lot: [id number]   NDC: [address]-6

## 2019-11-10 DIAGNOSIS — R9389 Abnormal findings on diagnostic imaging of other specified body structures: Secondary | ICD-10-CM | POA: Diagnosis not present

## 2019-11-10 DIAGNOSIS — N840 Polyp of corpus uteri: Secondary | ICD-10-CM | POA: Diagnosis not present

## 2019-11-10 DIAGNOSIS — Z3202 Encounter for pregnancy test, result negative: Secondary | ICD-10-CM | POA: Diagnosis not present

## 2019-11-24 ENCOUNTER — Ambulatory Visit: Payer: Self-pay | Attending: Internal Medicine

## 2019-11-24 DIAGNOSIS — Z23 Encounter for immunization: Secondary | ICD-10-CM

## 2019-11-24 NOTE — Progress Notes (Signed)
   Covid-19 Vaccination Clinic  Name:  ALEANE WESENBERG    MRN: [id number] DOB: [birth year]  11/24/2019  Ms. Weng was observed post Covid-19 immunization for 15 minutes without incident. She was provided with Vaccine Information Sheet and instruction to access the V-Safe system.   Ms. Lefeber was instructed to call 911 with any severe reactions post vaccine: Marland Kitchen Difficulty breathing  . Swelling of face and throat  . A fast heartbeat  . A bad rash all over body  . Dizziness and weakness   Immunizations Administered    Name Date Dose VIS Date Route   Pfizer COVID-19 Vaccine 11/24/2019 10:25 AM 0.3 mL 08/19/2018 Intramuscular   Manufacturer: [employer], [employer]   Lot: [id number]   NDC: [address]-0

## 2019-12-04 ENCOUNTER — Telehealth: Payer: Self-pay | Admitting: Internal Medicine

## 2019-12-04 NOTE — Telephone Encounter (Addendum)
    Patient calling to report cold/ flu like symptoms, fever. Patient states she has already had COVID19 vaccine Call transferred to [facility] at [facility] for advice

## 2019-12-04 NOTE — Telephone Encounter (Signed)
[facility] Report : ---Caller states she has had a cough for 2-3 days. She states she has a fever but has not checked it.   Advise - Home care given.

## 2019-12-04 NOTE — Telephone Encounter (Signed)
Noted  

## 2019-12-07 ENCOUNTER — Ambulatory Visit: Payer: BC Managed Care – PPO | Attending: Internal Medicine

## 2019-12-07 DIAGNOSIS — Z20822 Contact with and (suspected) exposure to covid-19: Secondary | ICD-10-CM | POA: Insufficient documentation

## 2019-12-08 LAB — NOVEL CORONAVIRUS, NAA: SARS-CoV-2, NAA: NOT DETECTED

## 2019-12-08 LAB — SARS-COV-2, NAA 2 DAY TAT

## 2020-03-07 DIAGNOSIS — Z03818 Encounter for observation for suspected exposure to other biological agents ruled out: Secondary | ICD-10-CM | POA: Diagnosis not present

## 2020-03-28 DIAGNOSIS — Z03818 Encounter for observation for suspected exposure to other biological agents ruled out: Secondary | ICD-10-CM | POA: Diagnosis not present

## 2020-04-22 DIAGNOSIS — Z03818 Encounter for observation for suspected exposure to other biological agents ruled out: Secondary | ICD-10-CM | POA: Diagnosis not present

## 2020-05-09 DIAGNOSIS — Z03818 Encounter for observation for suspected exposure to other biological agents ruled out: Secondary | ICD-10-CM | POA: Diagnosis not present

## 2020-05-10 DIAGNOSIS — Z03818 Encounter for observation for suspected exposure to other biological agents ruled out: Secondary | ICD-10-CM | POA: Diagnosis not present

## 2020-05-25 DIAGNOSIS — Z03818 Encounter for observation for suspected exposure to other biological agents ruled out: Secondary | ICD-10-CM | POA: Diagnosis not present

## 2020-06-28 ENCOUNTER — Encounter: Payer: Self-pay | Admitting: Internal Medicine

## 2020-06-28 ENCOUNTER — Other Ambulatory Visit: Payer: Self-pay

## 2020-06-28 ENCOUNTER — Ambulatory Visit (INDEPENDENT_AMBULATORY_CARE_PROVIDER_SITE_OTHER): Payer: BC Managed Care – PPO | Admitting: Internal Medicine

## 2020-06-28 VITALS — BP 112/76 | HR 70 | Temp 98.3°F | Ht 61.0 in | Wt 149.8 lb

## 2020-06-28 DIAGNOSIS — H9312 Tinnitus, left ear: Secondary | ICD-10-CM | POA: Diagnosis not present

## 2020-06-28 DIAGNOSIS — Z0000 Encounter for general adult medical examination without abnormal findings: Secondary | ICD-10-CM | POA: Diagnosis not present

## 2020-06-28 DIAGNOSIS — K921 Melena: Secondary | ICD-10-CM | POA: Diagnosis not present

## 2020-06-28 MED ORDER — LORATADINE 10 MG PO TABS: 10.0000 mg | ORAL_TABLET | Freq: Every day | ORAL | 11 refills | Status: DC

## 2020-06-28 MED ORDER — FLUTICASONE PROPIONATE 50 MCG/ACT NA SUSP: 2.0000 | Freq: Every day | NASAL | 1 refills | Status: DC

## 2020-06-28 NOTE — Assessment & Plan Note (Signed)

## 2020-06-28 NOTE — Assessment & Plan Note (Addendum)
Off and on since pregnancy (x 10 years) GI ref

## 2020-06-28 NOTE — Progress Notes (Signed)
Subjective:  Patient ID: Sue Watkins, female    DOB: Oct 24, 1970  Age: 50 y.o. MRN: [id number]  CC: Annual Exam   HPI Sue Watkins presents for a well exam C/o L ear tinnitus  Outpatient Medications Prior to Visit  Medication Sig Dispense Refill  . cefUROXime (CEFTIN) 250 MG tablet Take 1 tablet (250 mg total) by mouth 2 (two) times daily. (Patient not taking: Reported on 06/28/2020) 20 tablet 0  . Cholecalciferol (VITAMIN D3) 50 MCG (2000 UT) capsule Take 1 capsule (2,000 Units total) by mouth daily. (Patient not taking: Reported on 06/28/2020) 100 capsule 3  . fluticasone (FLONASE) 50 MCG/ACT nasal spray Place 2 sprays into both nostrils daily. (Patient not taking: Reported on 06/28/2020) 16 g 1   No facility-administered medications prior to visit.    ROS: Review of Systems  Constitutional: Negative for activity change, appetite change, chills, fatigue and unexpected weight change.  HENT: Positive for tinnitus. Negative for congestion, mouth sores and sinus pressure.   Eyes: Negative for visual disturbance.  Respiratory: Negative for cough and chest tightness.   Gastrointestinal: Positive for blood in stool. Negative for abdominal pain, nausea and rectal pain.  Genitourinary: Negative for difficulty urinating, frequency and vaginal pain.  Musculoskeletal: Negative for back pain and gait problem.  Skin: Negative for pallor and rash.  Neurological: Negative for dizziness, tremors, weakness, numbness and headaches.  Psychiatric/Behavioral: Negative for confusion and sleep disturbance.    Objective:  BP 112/76 (BP Location: Left Arm)   Pulse 70   Temp 98.3 F (36.8 C) (Oral)   Ht 5\' 1"  (1.549 m)   Wt 149 lb 12.8 oz (67.9 kg)   SpO2 98%   BMI 28.30 kg/m   BP Readings from Last 3 Encounters:  06/28/20 112/76  06/15/19 118/82  06/13/18 118/80    Wt Readings from Last 3 Encounters:  06/28/20 149 lb 12.8 oz (67.9 kg)  06/15/19 147 lb (66.7 kg)  06/13/18 136 lb  (61.7 kg)    Physical Exam Constitutional:      General: She is not in acute distress.    Appearance: She is well-developed.  HENT:     Head: Normocephalic.     Right Ear: External ear normal.     Left Ear: External ear normal.     Nose: Nose normal.     Mouth/Throat:     Mouth: Oropharynx is clear and moist.  Eyes:     General:        Right eye: No discharge.        Left eye: No discharge.     Conjunctiva/sclera: Conjunctivae normal.     Pupils: Pupils are equal, round, and reactive to light.  Neck:     Thyroid: No thyromegaly.     Vascular: No JVD.     Trachea: No tracheal deviation.  Cardiovascular:     Rate and Rhythm: Normal rate and regular rhythm.     Heart sounds: Normal heart sounds.  Pulmonary:     Effort: No respiratory distress.     Breath sounds: No stridor. No wheezing.  Abdominal:     General: Bowel sounds are normal. There is no distension.     Palpations: Abdomen is soft. There is no mass.     Tenderness: There is no abdominal tenderness. There is no guarding or rebound.  Musculoskeletal:        General: No tenderness or edema.     Cervical back: Normal range of motion and neck  supple.  Lymphadenopathy:     Cervical: No cervical adenopathy.  Skin:    Findings: No erythema or rash.  Neurological:     Mental Status: She is oriented to person, place, and time.     Cranial Nerves: No cranial nerve deficit.     Motor: No abnormal muscle tone.     Coordination: Coordination normal.     Deep Tendon Reflexes: Reflexes normal.  Psychiatric:        Mood and Affect: Mood and affect normal.        Behavior: Behavior normal.        Thought Content: Thought content normal.        Judgment: Judgment normal.     Lab Results  Component Value Date   WBC 8.8 06/16/2019   HGB 13.7 06/16/2019   HCT 40.6 06/16/2019   PLT 185.0 06/16/2019   GLUCOSE 95 06/16/2019   CHOL 153 06/16/2019   TRIG 68.0 06/16/2019   HDL 45.60 06/16/2019   LDLCALC 94 06/16/2019    ALT 17 06/16/2019   AST 14 06/16/2019   NA 135 06/16/2019   K 3.8 06/16/2019   CL 102 06/16/2019   CREATININE 0.67 06/16/2019   BUN 13 06/16/2019   CO2 26 06/16/2019   TSH 1.74 06/16/2019    US THYROID  Result Date: 07/02/2018 CLINICAL DATA:  Enlarged thyroid on exam EXAM: THYROID ULTRASOUND TECHNIQUE: Ultrasound examination of the thyroid gland and adjacent soft tissues was performed. COMPARISON:  None. FINDINGS: Parenchymal Echotexture: Normal Isthmus: 2 mm Right lobe: 4.7 x 1.2 x 2.0 cm Left lobe: 5.1 x 1.5 x 1.7 cm _________________________________________________________ Estimated total number of nodules >/= 1 cm: 0 Number of spongiform nodules >/=  2 cm not described below (TR1): 0 Number of mixed cystic and solid nodules >/= 1.5 cm not described below (TR2): 0 _________________________________________________________ No discrete nodules are seen within the thyroid gland. IMPRESSION: Normal thyroid ultrasound for age The above is in keeping with the ACR TI-RADS recommendations - J Am Coll Radiol 2017;14:587-595. Electronically Signed   By: Judie Petit.  Shick M.D.   On: 07/02/2018 17:10    Assessment & Plan:    Follow-up: No follow-ups on file.  Sonda Primes, MD

## 2020-06-28 NOTE — Assessment & Plan Note (Signed)
L - s/p ENT ref

## 2020-06-29 ENCOUNTER — Other Ambulatory Visit (INDEPENDENT_AMBULATORY_CARE_PROVIDER_SITE_OTHER): Payer: BC Managed Care – PPO

## 2020-06-29 DIAGNOSIS — Z0000 Encounter for general adult medical examination without abnormal findings: Secondary | ICD-10-CM

## 2020-06-29 LAB — LIPID PANEL
Cholesterol: 184 mg/dL (ref 0–200)
HDL: 45.3 mg/dL (ref >39.00)
LDL Cholesterol: 119 mg/dL — ABNORMAL HIGH (ref 0–99)
NonHDL: 138.77
Total CHOL/HDL Ratio: 4
Triglycerides: 101 mg/dL (ref 0.0–149.0)
VLDL: 20.2 mg/dL (ref 0.0–40.0)

## 2020-06-29 LAB — CBC WITH DIFFERENTIAL/PLATELET
Basophils Absolute: 0 10*3/uL (ref 0.0–0.1)
Basophils Relative: 0.6 % (ref 0.0–3.0)
Eosinophils Absolute: 0.1 10*3/uL (ref 0.0–0.7)
Eosinophils Relative: 1.8 % (ref 0.0–5.0)
HCT: 41.3 % (ref 36.0–46.0)
Hemoglobin: 14.1 g/dL (ref 12.0–15.0)
Lymphocytes Relative: 28.6 % (ref 12.0–46.0)
Lymphs Abs: 2.2 10*3/uL (ref 0.7–4.0)
MCHC: 34.1 g/dL (ref 30.0–36.0)
MCV: 89.8 fl (ref 78.0–100.0)
Monocytes Absolute: 0.6 10*3/uL (ref 0.1–1.0)
Monocytes Relative: 7.4 % (ref 3.0–12.0)
Neutro Abs: 4.7 10*3/uL (ref 1.4–7.7)
Neutrophils Relative %: 61.6 % (ref 43.0–77.0)
Platelets: 211 10*3/uL (ref 150.0–400.0)
RBC: 4.6 Mil/uL (ref 3.87–5.11)
RDW: 13.3 % (ref 11.5–15.5)
WBC: 7.6 10*3/uL (ref 4.0–10.5)

## 2020-06-29 LAB — URINALYSIS
Bilirubin Urine: NEGATIVE
Ketones, ur: NEGATIVE
Leukocytes,Ua: NEGATIVE
Nitrite: NEGATIVE
Specific Gravity, Urine: >=1.03 — AB (ref 1.000–1.030)
Total Protein, Urine: NEGATIVE
Urine Glucose: NEGATIVE
Urobilinogen, UA: 0.2 (ref 0.0–1.0)
pH: 5 (ref 5.0–8.0)

## 2020-06-29 LAB — COMPREHENSIVE METABOLIC PANEL
ALT: 18 U/L (ref 0–35)
AST: 16 U/L (ref 0–37)
Albumin: 4.4 g/dL (ref 3.5–5.2)
Alkaline Phosphatase: 53 U/L (ref 39–117)
BUN: 15 mg/dL (ref 6–23)
CO2: 27 mEq/L (ref 19–32)
Calcium: 9.4 mg/dL (ref 8.4–10.5)
Chloride: 103 mEq/L (ref 96–112)
Creatinine, Ser: 0.73 mg/dL (ref 0.40–1.20)
GFR: 97.84 mL/min (ref >60.00)
Glucose, Bld: 92 mg/dL (ref 70–99)
Potassium: 3.9 mEq/L (ref 3.5–5.1)
Sodium: 136 mEq/L (ref 135–145)
Total Bilirubin: 0.5 mg/dL (ref 0.2–1.2)
Total Protein: 7.5 g/dL (ref 6.0–8.3)

## 2020-07-13 ENCOUNTER — Encounter: Payer: Self-pay | Admitting: Gastroenterology

## 2020-07-17 DIAGNOSIS — Z20822 Contact with and (suspected) exposure to covid-19: Secondary | ICD-10-CM | POA: Diagnosis not present

## 2020-07-17 DIAGNOSIS — Z03818 Encounter for observation for suspected exposure to other biological agents ruled out: Secondary | ICD-10-CM | POA: Diagnosis not present

## 2020-07-19 ENCOUNTER — Ambulatory Visit: Payer: BC Managed Care – PPO | Attending: Internal Medicine

## 2020-07-19 DIAGNOSIS — Z23 Encounter for immunization: Secondary | ICD-10-CM

## 2020-07-19 NOTE — Progress Notes (Signed)
   Covid-19 Vaccination Clinic  Name:  Sue Watkins    MRN: [id number] DOB: 01/05/1971  07/19/2020  [name] was observed post Covid-19 immunization for 15 minutes without incident. She was provided with Vaccine Information Sheet and instruction to access the V-Safe system.   [name] was instructed to call 911 with any severe reactions post vaccine: Marland Kitchen Difficulty breathing  . Swelling of face and throat  . A fast heartbeat  . A bad rash all over body  . Dizziness and weakness   Immunizations Administered    Name Date Dose VIS Date Route   Moderna Covid-19 Booster Vaccine 07/19/2020  3:00 PM 0.25 mL 04/13/2020 Intramuscular   Manufacturer: Moderna   Lot: [id number]   NDC: 51761-607-37

## 2020-08-01 DIAGNOSIS — Z03818 Encounter for observation for suspected exposure to other biological agents ruled out: Secondary | ICD-10-CM | POA: Diagnosis not present

## 2020-08-01 DIAGNOSIS — Z20822 Contact with and (suspected) exposure to covid-19: Secondary | ICD-10-CM | POA: Diagnosis not present

## 2020-08-02 DIAGNOSIS — Z03818 Encounter for observation for suspected exposure to other biological agents ruled out: Secondary | ICD-10-CM | POA: Diagnosis not present

## 2020-08-18 ENCOUNTER — Ambulatory Visit: Payer: BC Managed Care – PPO | Admitting: Gastroenterology

## 2021-01-16 ENCOUNTER — Telehealth: Payer: BC Managed Care – PPO | Admitting: Physician Assistant

## 2021-01-16 ENCOUNTER — Encounter: Payer: Self-pay | Admitting: Physician Assistant

## 2021-01-16 DIAGNOSIS — A084 Viral intestinal infection, unspecified: Secondary | ICD-10-CM | POA: Diagnosis not present

## 2021-01-16 MED ORDER — ONDANSETRON HCL 4 MG PO TABS: 4.0000 mg | ORAL_TABLET | Freq: Three times a day (TID) | ORAL | 0 refills | Status: DC | PRN

## 2021-01-16 NOTE — Progress Notes (Signed)
Virtual Visit Consent   Sue Watkins, you are scheduled for a virtual visit with a [facility] provider today.     Just as with appointments in the office, your consent must be obtained to participate.  Your consent will be active for this visit and any virtual visit you may have with one of our providers in the next 365 days.     If you have a MyChart account, a copy of this consent can be sent to you electronically.  All virtual visits are billed to your insurance company just like a traditional visit in the office.    As this is a virtual visit, video technology does not allow for your provider to perform a traditional examination.  This may limit your provider's ability to fully assess your condition.  If your provider identifies any concerns that need to be evaluated in person or the need to arrange testing (such as labs, EKG, etc.), we will make arrangements to do so.     Although advances in technology are sophisticated, we cannot ensure that it will always work on either your end or our end.  If the connection with a video visit is poor, the visit may have to be switched to a telephone visit.  With either a video or telephone visit, we are not always able to ensure that we have a secure connection.     I need to obtain your verbal consent now.   Are you willing to proceed with your visit today?    Sue Watkins has provided verbal consent on 01/16/2021 for a virtual visit (video or telephone).   Sue Loveless, PA-C   Date: 01/16/2021 2:40 PM   Virtual Visit via Video Note   IMargaretann Watkins, connected with  Sue Watkins  ([id number], 1971/04/29) on 01/16/21 at  2:15 PM EDT by a video-enabled telemedicine application and verified that I am speaking with the correct person using two identifiers.  Location: Patient: Home Provider: Virtual Visit Location Provider: Home Office   I discussed the limitations of evaluation and management by telemedicine and the  availability of in person appointments. The patient expressed understanding and agreed to proceed.    History of Present Illness: Sue Watkins is a 50 y.o. who identifies as a female who was assigned female at birth, and is being seen today for abdominal pain and fever.  HPI: Abdominal Pain This is a new problem. The current episode started yesterday. The onset quality is sudden. The problem occurs constantly. The problem has been unchanged. The pain is located in the LUQ and epigastric region. The pain is at a severity of 3/10. The pain is mild. The quality of the pain is aching. The abdominal pain does not radiate. Associated symptoms include diarrhea (watery) and nausea. Pertinent negatives include no vomiting. The pain is aggravated by eating and certain positions. The pain is relieved by Nothing. She has tried nothing for the symptoms. The treatment provided no relief.    Reports pain started last night after she ate some fried foods. Her husband and daughter ate same food and has not been ill. She reports LUQ and left middle pain and discomfort. Mild nausea. NO vomiting. One episode of watery diarrhea this morning. Low grade fever 99.7. Reports has had similar pain in the past about 2 years ago. Denies melena, hematochezia, jaundice.  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  Hematochezia 06/28/2020   Thyroid nodule 06/13/2018   Upper respiratory infection 05/30/2017   Left wrist pain 11/07/2016   Well adult exam 04/30/2016   Sinusitis, chronic 04/30/2016   Anemia 04/30/2016   Grief 04/30/2016   GERD (gastroesophageal reflux disease) 04/23/2012   Allergic rhinitis 04/23/2012   Overweight (BMI 25.0-29.9) 04/23/2012    Allergies: No Known Allergies Medications:  Current Outpatient Medications:    ondansetron (ZOFRAN) 4 MG tablet, Take 1 tablet (4 mg total) by mouth every 8 (eight) hours as needed for nausea or vomiting.,  Disp: 20 tablet, Rfl: 0   fluticasone (FLONASE) 50 MCG/ACT nasal spray, Place 2 sprays into both nostrils daily., Disp: 16 g, Rfl: 1   loratadine (CLARITIN) 10 MG tablet, Take 1 tablet (10 mg total) by mouth daily., Disp: 30 tablet, Rfl: 11  Observations/Objective: Patient is well-developed, well-nourished in no acute distress.  Resting comfortably at home.  Head is normocephalic, atraumatic.  No labored breathing.  Speech is clear and coherent with logical content.  Patient is alert and oriented at baseline.    Assessment and Plan: 1. Viral gastroenteritis - ondansetron (ZOFRAN) 4 MG tablet; Take 1 tablet (4 mg total) by mouth every 8 (eight) hours as needed for nausea or vomiting.  Dispense: 20 tablet; Refill: 0 - DDx includes viral gastroenteritis, PUD, subacute pancreatitis, gallbladder disease - Will treat as viral gastroenteritis - Zofran provided for nausea and abd cramping - Advised to try Omeprazole as well OTC - May benefit by adding Bentyl if abdominal cramping not improving with Zofran and omeprazole - Bland diet and increase as tolerated - Push fluids - Advised if symptoms worsen or fail to improve she should be seen in person for further evaluation  Follow Up Instructions: I discussed the assessment and treatment plan with the patient. The patient was provided an opportunity to ask questions and all were answered. The patient agreed with the plan and demonstrated an understanding of the instructions.  A copy of instructions were sent to the patient via MyChart.  The patient was advised to call back or seek an in-person evaluation if the symptoms worsen or if the condition fails to improve as anticipated.  Time:  I spent 12 minutes with the patient via telehealth technology discussing the above problems/concerns.    Sue Loveless, PA-C

## 2021-01-16 NOTE — Patient Instructions (Addendum)
Sue Watkins, thank you for joining Margaretann Loveless, PA-C for today's virtual visit.  While this provider is not your primary care provider (PCP), if your PCP is located in our provider database this encounter information will be shared with them immediately following your visit.  Consent: (Patient) Sue Watkins provided verbal consent for this virtual visit at the beginning of the encounter.  Current Medications:  Current Outpatient Medications:    ondansetron (ZOFRAN) 4 MG tablet, Take 1 tablet (4 mg total) by mouth every 8 (eight) hours as needed for nausea or vomiting., Disp: 20 tablet, Rfl: 0   fluticasone (FLONASE) 50 MCG/ACT nasal spray, Place 2 sprays into both nostrils daily., Disp: 16 g, Rfl: 1   loratadine (CLARITIN) 10 MG tablet, Take 1 tablet (10 mg total) by mouth daily., Disp: 30 tablet, Rfl: 11   Medications ordered in this encounter:  Meds ordered this encounter  Medications   ondansetron (ZOFRAN) 4 MG tablet    Sig: Take 1 tablet (4 mg total) by mouth every 8 (eight) hours as needed for nausea or vomiting.    Dispense:  20 tablet    Refill:  0    Order Specific Question:   Supervising Provider    Answer:   Hyacinth Meeker, BRIAN [[id number]]     *If you need refills on other medications prior to your next appointment, please contact your pharmacy*  Follow-Up: Call back or seek an in-person evaluation if the symptoms worsen or if the condition fails to improve as anticipated.  Other Instructions See Below  If you have been instructed to have an in-person evaluation today at a local Urgent Care facility, please use the link below. It will take you to a list of all of our available [facility], including address, phone number and hours of operation. Please do not delay care.  [facility]  If you or a family member do not have a primary care provider, use the link below to schedule a visit and establish care. When you choose a [facility]  primary care physician or advanced practice provider, you gain a long-term partner in health. Find a Primary Care Provider  Learn more about [facility] in-office and virtual care options:  - Get Care Now   Viral Gastroenteritis, Adult  Viral gastroenteritis is also known as the stomach flu. This condition may affect your stomach, small intestine, and large intestine. It can cause sudden watery diarrhea, fever, and vomiting. This condition is caused by many different viruses. These viruses can be passed from person to person very easily (are contagious). Diarrhea and vomiting can make you feel weak and cause you to become dehydrated. You may not be able to keep fluids down. Dehydration can make you tired and thirsty, cause you to have a dry mouth, and decrease how often you urinate. It is important to replace the fluids that you lose from diarrhea andvomiting. What are the causes? Gastroenteritis is caused by many viruses, including rotavirus and norovirus. Norovirus is the most common cause in adults. You can get sick after being exposed to the viruses from other people. You can also get sick by: Eating food, drinking water, or touching a surface contaminated with one of these viruses. Sharing utensils or other personal items with an infected person. What increases the risk? You are more likely to develop this condition if you: Have a weak body defense system (immune system). Live with one or more children who are younger  than 61 years old. Live in a nursing home. Travel on cruise ships. What are the signs or symptoms? Symptoms of this condition start suddenly 1-3 days after exposure to a virus. Symptoms may last for a few days or for as long as a week. Common symptoms include watery diarrhea and vomiting. Other symptoms include: Fever. Headache. Fatigue. Pain in the abdomen. Chills. Weakness. Nausea. Muscle aches. Loss of appetite. How is this diagnosed? This condition  is diagnosed with a medical history and physical exam. You mayalso have a stool test to check for viruses or other infections. How is this treated? This condition typically goes away on its own. The focus of treatment is to prevent dehydration and restore lost fluids (rehydration). This condition may be treated with: An oral rehydration solution (ORS) to replace important salts and minerals (electrolytes) in your body. Take this if told by your health care provider. This is a drink that is sold at pharmacies and retail stores. Medicines to help with your symptoms. Probiotic supplements to reduce symptoms of diarrhea. Fluids given through an IV, if dehydration is severe. Older adults and people with other diseases or a weak immune system are athigher risk for dehydration. Follow these instructions at home:  Eating and drinking  Take an ORS as told by your health care provider. Drink clear fluids in small amounts as you are able. Clear fluids include: Water. Ice chips. Diluted fruit juice. Low-calorie sports drinks. Drink enough fluid to keep your urine pale yellow. Eat small amounts of healthy foods every 3-4 hours as you are able. This may include whole grains, fruits, vegetables, lean meats, and yogurt. Avoid fluids that contain a lot of sugar or caffeine, such as energy drinks, sports drinks, and soda. Avoid spicy or fatty foods. Avoid alcohol.  General instructions Wash your hands often, especially after having diarrhea or vomiting. If soap and water are not available, use hand sanitizer. Make sure that all people in your household wash their hands well and often. Take over-the-counter and prescription medicines only as told by your health care provider. Rest at home while you recover. Watch your condition for any changes. Take a warm bath to relieve any burning or pain from frequent diarrhea episodes. Keep all follow-up visits as told by your health care provider. This is  important. Contact a health care provider if you: Cannot keep fluids down. Have symptoms that get worse. Have new symptoms. Feel light-headed or dizzy. Have muscle cramps. Get help right away if you: Have chest pain. Feel extremely weak or you faint. See blood in your vomit. Have vomit that looks like coffee grounds. Have bloody or black stools or stools that look like tar. Have a severe headache, a stiff neck, or both. Have a rash. Have severe pain, cramping, or bloating in your abdomen. Have trouble breathing or you are breathing very quickly. Have a fast heartbeat. Have skin that feels cold and clammy. Feel confused. Have pain when you urinate. Have signs of dehydration, such as: Dark urine, very little urine, or no urine. Cracked lips. Dry mouth. Sunken eyes. Sleepiness. Weakness. Summary Viral gastroenteritis is also known as the stomach flu. It can cause sudden watery diarrhea, fever, and vomiting. This condition can be passed from person to person very easily (is contagious). Take an ORS if told by your health care provider. This is a drink that is sold at pharmacies and retail stores. Wash your hands often, especially after having diarrhea or vomiting. If soap and water are  not available, use hand sanitizer. This information is not intended to replace advice given to you by your health care provider. Make sure you discuss any questions you have with your healthcare provider. Document Revised: 11/28/2018 Document Reviewed: 04/16/2018 Elsevier Patient Education  2022 [employer].

## 2021-01-26 ENCOUNTER — Other Ambulatory Visit: Payer: Self-pay

## 2021-01-26 ENCOUNTER — Encounter: Payer: Self-pay | Admitting: Internal Medicine

## 2021-01-26 ENCOUNTER — Ambulatory Visit: Payer: BC Managed Care – PPO | Admitting: Internal Medicine

## 2021-01-26 VITALS — BP 122/80 | HR 72 | Temp 98.7°F | Ht 61.0 in | Wt 152.0 lb

## 2021-01-26 DIAGNOSIS — K219 Gastro-esophageal reflux disease without esophagitis: Secondary | ICD-10-CM | POA: Diagnosis not present

## 2021-01-26 DIAGNOSIS — Z0000 Encounter for general adult medical examination without abnormal findings: Secondary | ICD-10-CM

## 2021-01-26 DIAGNOSIS — R112 Nausea with vomiting, unspecified: Secondary | ICD-10-CM | POA: Diagnosis not present

## 2021-01-26 DIAGNOSIS — R109 Unspecified abdominal pain: Secondary | ICD-10-CM | POA: Insufficient documentation

## 2021-01-26 DIAGNOSIS — R1013 Epigastric pain: Secondary | ICD-10-CM | POA: Diagnosis not present

## 2021-01-26 LAB — COMPREHENSIVE METABOLIC PANEL
ALT: 19 U/L (ref 0–35)
AST: 18 U/L (ref 0–37)
Albumin: 4.2 g/dL (ref 3.5–5.2)
Alkaline Phosphatase: 50 U/L (ref 39–117)
BUN: 13 mg/dL (ref 6–23)
CO2: 25 mEq/L (ref 19–32)
Calcium: 9.4 mg/dL (ref 8.4–10.5)
Chloride: 103 mEq/L (ref 96–112)
Creatinine, Ser: 0.73 mg/dL (ref 0.40–1.20)
GFR: 97.45 mL/min (ref >60.00)
Glucose, Bld: 93 mg/dL (ref 70–99)
Potassium: 4.2 mEq/L (ref 3.5–5.1)
Sodium: 136 mEq/L (ref 135–145)
Total Bilirubin: 0.4 mg/dL (ref 0.2–1.2)
Total Protein: 7.5 g/dL (ref 6.0–8.3)

## 2021-01-26 LAB — URINALYSIS
Bilirubin Urine: NEGATIVE
Ketones, ur: NEGATIVE
Leukocytes,Ua: NEGATIVE
Nitrite: NEGATIVE
Specific Gravity, Urine: 1.025 (ref 1.000–1.030)
Total Protein, Urine: NEGATIVE
Urine Glucose: NEGATIVE
Urobilinogen, UA: 0.2 (ref 0.0–1.0)
pH: 6 (ref 5.0–8.0)

## 2021-01-26 LAB — H. PYLORI ANTIBODY, IGG: H Pylori IgG: NEGATIVE

## 2021-01-26 LAB — CBC WITH DIFFERENTIAL/PLATELET
Basophils Absolute: 0 10*3/uL (ref 0.0–0.1)
Basophils Relative: 0.5 % (ref 0.0–3.0)
Eosinophils Absolute: 0.2 10*3/uL (ref 0.0–0.7)
Eosinophils Relative: 3.1 % (ref 0.0–5.0)
HCT: 41.9 % (ref 36.0–46.0)
Hemoglobin: 14 g/dL (ref 12.0–15.0)
Lymphocytes Relative: 29.8 % (ref 12.0–46.0)
Lymphs Abs: 2.3 10*3/uL (ref 0.7–4.0)
MCHC: 33.3 g/dL (ref 30.0–36.0)
MCV: 91.4 fl (ref 78.0–100.0)
Monocytes Absolute: 0.5 10*3/uL (ref 0.1–1.0)
Monocytes Relative: 6.5 % (ref 3.0–12.0)
Neutro Abs: 4.6 10*3/uL (ref 1.4–7.7)
Neutrophils Relative %: 60.1 % (ref 43.0–77.0)
Platelets: 233 10*3/uL (ref 150.0–400.0)
RBC: 4.58 Mil/uL (ref 3.87–5.11)
RDW: 13.2 % (ref 11.5–15.5)
WBC: 7.6 10*3/uL (ref 4.0–10.5)

## 2021-01-26 LAB — TSH: TSH: 1.83 u[IU]/mL (ref 0.35–5.50)

## 2021-01-26 LAB — LIPASE: Lipase: 29 U/L (ref 11.0–59.0)

## 2021-01-26 MED ORDER — PANTOPRAZOLE SODIUM 40 MG PO TBEC: 40.0000 mg | DELAYED_RELEASE_TABLET | Freq: Every day | ORAL | 3 refills | Status: DC

## 2021-01-26 NOTE — Assessment & Plan Note (Addendum)
Episodic - severe R/o GS, PUD Will get abd [place], lipase, CMET, CBC, UA Start Protonix

## 2021-01-26 NOTE — Addendum Note (Signed)
Addended by: Waldemar Dickens B on: 01/26/2021 09:30 AM   Modules accepted: Orders

## 2021-01-26 NOTE — Progress Notes (Signed)
Subjective:  Patient ID: Sue Watkins, female    DOB: [birth year]  Age: 50 y.o. MRN: [id number]  CC: No chief complaint on file.   HPI Sue Watkins presents for epigastric pain and LLQ pain w/fever, diarrhea after eating fatty foods. Last time last week. It lasted x 1 day.  Outpatient Medications Prior to Visit  Medication Sig Dispense Refill   fluticasone (FLONASE) 50 MCG/ACT nasal spray Place 2 sprays into both nostrils daily. 16 g 1   loratadine (CLARITIN) 10 MG tablet Take 1 tablet (10 mg total) by mouth daily. 30 tablet 11   ondansetron (ZOFRAN) 4 MG tablet Take 1 tablet (4 mg total) by mouth every 8 (eight) hours as needed for nausea or vomiting. 20 tablet 0   No facility-administered medications prior to visit.    ROS: Review of Systems  Constitutional:  Negative for activity change, appetite change, chills, fatigue and unexpected weight change.  HENT:  Negative for congestion, mouth sores and sinus pressure.   Eyes:  Negative for visual disturbance.  Respiratory:  Negative for cough and chest tightness.   Gastrointestinal:  Positive for abdominal pain. Negative for nausea.  Genitourinary:  Negative for difficulty urinating, frequency and vaginal pain.  Musculoskeletal:  Positive for back pain. Negative for gait problem.  Skin:  Negative for pallor and rash.  Neurological:  Negative for dizziness, tremors, weakness, numbness and headaches.  Psychiatric/Behavioral:  Negative for confusion and sleep disturbance.    Objective:  BP 122/80 (BP Location: Left Arm, Patient Position: Sitting, Cuff Size: Normal)   Pulse 72   Temp 98.7 F (37.1 C) (Oral)   Ht 5\' 1"  (1.549 m)   Wt 152 lb (68.9 kg)   SpO2 97%   BMI 28.72 kg/m   BP Readings from Last 3 Encounters:  01/26/21 122/80  06/28/20 112/76  06/15/19 118/82    Wt Readings from Last 3 Encounters:  01/26/21 152 lb (68.9 kg)  06/28/20 149 lb 12.8 oz (67.9 kg)  06/15/19 147 lb (66.7 kg)    Physical  Exam Constitutional:      General: She is not in acute distress.    Appearance: She is well-developed. She is obese.  HENT:     Head: Normocephalic.     Right Ear: External ear normal.     Left Ear: External ear normal.     Nose: Nose normal.  Eyes:     General:        Right eye: No discharge.        Left eye: No discharge.     Conjunctiva/sclera: Conjunctivae normal.     Pupils: Pupils are equal, round, and reactive to light.  Neck:     Thyroid: No thyromegaly.     Vascular: No JVD.     Trachea: No tracheal deviation.  Cardiovascular:     Rate and Rhythm: Normal rate and regular rhythm.     Heart sounds: Normal heart sounds.  Pulmonary:     Effort: No respiratory distress.     Breath sounds: No stridor. No wheezing.  Abdominal:     General: Bowel sounds are normal. There is no distension.     Palpations: Abdomen is soft. There is no mass.     Tenderness: There is no abdominal tenderness. There is no guarding or rebound.  Musculoskeletal:        General: No tenderness.     Cervical back: Normal range of motion and neck supple. No rigidity.  Lymphadenopathy:  Cervical: No cervical adenopathy.  Skin:    Findings: No erythema or rash.  Neurological:     Cranial Nerves: No cranial nerve deficit.     Motor: No abnormal muscle tone.     Coordination: Coordination normal.     Deep Tendon Reflexes: Reflexes normal.  Psychiatric:        Behavior: Behavior normal.        Thought Content: Thought content normal.        Judgment: Judgment normal.    Lab Results  Component Value Date   WBC 7.6 06/29/2020   HGB 14.1 06/29/2020   HCT 41.3 06/29/2020   PLT 211.0 06/29/2020   GLUCOSE 92 06/29/2020   CHOL 184 06/29/2020   TRIG 101.0 06/29/2020   HDL 45.30 06/29/2020   LDLCALC 119 (H) 06/29/2020   ALT 18 06/29/2020   AST 16 06/29/2020   NA 136 06/29/2020   K 3.9 06/29/2020   CL 103 06/29/2020   CREATININE 0.73 06/29/2020   BUN 15 06/29/2020   CO2 27 06/29/2020   TSH  1.74 06/16/2019    US THYROID  Result Date: 07/02/2018 CLINICAL DATA:  Enlarged thyroid on exam EXAM: THYROID ULTRASOUND TECHNIQUE: Ultrasound examination of the thyroid gland and adjacent soft tissues was performed. COMPARISON:  None. FINDINGS: Parenchymal Echotexture: Normal Isthmus: 2 mm Right lobe: 4.7 x 1.2 x 2.0 cm Left lobe: 5.1 x 1.5 x 1.7 cm _________________________________________________________ Estimated total number of nodules >/= 1 cm: 0 Number of spongiform nodules >/=  2 cm not described below (TR1): 0 Number of mixed cystic and solid nodules >/= 1.5 cm not described below (TR2): 0 _________________________________________________________ No discrete nodules are seen within the thyroid gland. IMPRESSION: Normal thyroid ultrasound for age The above is in keeping with the ACR TI-RADS recommendations - J Am Coll Radiol 2017;14:587-595. Electronically Signed   By: Judie Petit.  Shick M.D.   On: 07/02/2018 17:10    Assessment & Plan:     Sonda Primes, MD

## 2021-01-26 NOTE — Assessment & Plan Note (Signed)
Repeat H pylori test Start Protonix Zofran prn

## 2021-02-07 ENCOUNTER — Ambulatory Visit
Admission: RE | Admit: 2021-02-07 | Discharge: 2021-02-07 | Disposition: A | Discharge status: Home / Self Care | Payer: BC Managed Care – PPO | Source: Ambulatory Visit | Attending: Internal Medicine | Admitting: Internal Medicine

## 2021-02-07 DIAGNOSIS — K760 Fatty (change of) liver, not elsewhere classified: Secondary | ICD-10-CM | POA: Diagnosis not present

## 2021-02-07 DIAGNOSIS — R1010 Upper abdominal pain, unspecified: Secondary | ICD-10-CM | POA: Diagnosis not present

## 2021-02-07 DIAGNOSIS — K8020 Calculus of gallbladder without cholecystitis without obstruction: Secondary | ICD-10-CM | POA: Diagnosis not present

## 2021-02-09 ENCOUNTER — Encounter: Payer: Self-pay | Admitting: Internal Medicine

## 2021-02-09 ENCOUNTER — Ambulatory Visit: Payer: BC Managed Care – PPO | Admitting: Internal Medicine

## 2021-02-09 ENCOUNTER — Other Ambulatory Visit: Payer: Self-pay

## 2021-02-09 DIAGNOSIS — K8020 Calculus of gallbladder without cholecystitis without obstruction: Secondary | ICD-10-CM

## 2021-02-09 DIAGNOSIS — R1013 Epigastric pain: Secondary | ICD-10-CM

## 2021-02-09 DIAGNOSIS — N281 Cyst of kidney, acquired: Secondary | ICD-10-CM | POA: Insufficient documentation

## 2021-02-09 MED ORDER — AMOXICILLIN-POT CLAVULANATE 875-125 MG PO TABS: 1.0000 | ORAL_TABLET | Freq: Two times a day (BID) | ORAL | 0 refills | Status: DC

## 2021-02-09 NOTE — Assessment & Plan Note (Signed)
2 cm GS in the gallbladder by ultrasound Symptomatic - 2 previous attacks of abdominal pain, nausea and fever Discussed with the patient.  Will obtain a surgical consultation. Will continue with Protonix for now I gave her a prescription for Augmentin in case her symptoms relapsed.  She knows to go to ER if worse

## 2021-02-09 NOTE — Progress Notes (Signed)
Subjective:  Patient ID: Sue Watkins, female    DOB: June 30, 1970  Age: 50 y.o. MRN: [id number]  CC: 2 Week Follow-up   HPI Sue Watkins presents for 2 previous episodes of abd pain with nausea and fever f/u.  No relapse. We need to follow-up on her recent abdominal ultrasound with abnormal findings in the gallbladder and in the left kidney.  Outpatient Medications Prior to Visit  Medication Sig Dispense Refill   fluticasone (FLONASE) 50 MCG/ACT nasal spray Place 2 sprays into both nostrils daily. 16 g 1   loratadine (CLARITIN) 10 MG tablet Take 1 tablet (10 mg total) by mouth daily. 30 tablet 11   ondansetron (ZOFRAN) 4 MG tablet Take 1 tablet (4 mg total) by mouth every 8 (eight) hours as needed for nausea or vomiting. 20 tablet 0   pantoprazole (PROTONIX) 40 MG tablet Take 1 tablet (40 mg total) by mouth daily. 30 tablet 3   No facility-administered medications prior to visit.    ROS: Review of Systems  Constitutional:  Negative for activity change, appetite change, chills, fatigue, fever and unexpected weight change.  HENT:  Negative for congestion, mouth sores and sinus pressure.   Eyes:  Negative for visual disturbance.  Respiratory:  Negative for cough and chest tightness.   Gastrointestinal:  Positive for abdominal pain and nausea. Negative for diarrhea.  Genitourinary:  Negative for difficulty urinating, frequency and vaginal pain.  Musculoskeletal:  Negative for back pain and gait problem.  Skin:  Negative for pallor and rash.  Neurological:  Negative for dizziness, tremors, weakness, numbness and headaches.  Psychiatric/Behavioral:  Negative for confusion and sleep disturbance.    Objective:  BP 118/82 (BP Location: Left Arm, Patient Position: Sitting, Cuff Size: Normal)   Pulse 82   Temp 98.3 F (36.8 C) (Oral)   Ht 5\' 1"  (1.549 m)   Wt 146 lb (66.2 kg)   SpO2 98%   BMI 27.59 kg/m   BP Readings from Last 3 Encounters:  02/09/21 118/82  01/26/21  122/80  06/28/20 112/76    Wt Readings from Last 3 Encounters:  02/09/21 146 lb (66.2 kg)  01/26/21 152 lb (68.9 kg)  06/28/20 149 lb 12.8 oz (67.9 kg)    Physical Exam Constitutional:      General: She is not in acute distress.    Appearance: She is well-developed.  HENT:     Head: Normocephalic.     Right Ear: External ear normal.     Left Ear: External ear normal.     Nose: Nose normal.  Eyes:     General:        Right eye: No discharge.        Left eye: No discharge.     Conjunctiva/sclera: Conjunctivae normal.     Pupils: Pupils are equal, round, and reactive to light.  Neck:     Thyroid: No thyromegaly.     Vascular: No JVD.     Trachea: No tracheal deviation.  Cardiovascular:     Rate and Rhythm: Normal rate and regular rhythm.     Heart sounds: Normal heart sounds.  Pulmonary:     Effort: No respiratory distress.     Breath sounds: No stridor. No wheezing.  Abdominal:     General: Bowel sounds are normal. There is no distension.     Palpations: Abdomen is soft. There is no mass.     Tenderness: There is no abdominal tenderness. There is no guarding or rebound.  Musculoskeletal:        General: No tenderness.     Cervical back: Normal range of motion and neck supple. No rigidity.  Lymphadenopathy:     Cervical: No cervical adenopathy.  Skin:    Findings: No erythema or rash.  Neurological:     Cranial Nerves: No cranial nerve deficit.     Motor: No abnormal muscle tone.     Coordination: Coordination normal.     Deep Tendon Reflexes: Reflexes normal.  Psychiatric:        Behavior: Behavior normal.        Thought Content: Thought content normal.        Judgment: Judgment normal.    Lab Results  Component Value Date   WBC 7.6 01/26/2021   HGB 14.0 01/26/2021   HCT 41.9 01/26/2021   PLT 233.0 01/26/2021   GLUCOSE 93 01/26/2021   CHOL 184 06/29/2020   TRIG 101.0 06/29/2020   HDL 45.30 06/29/2020   LDLCALC 119 (H) 06/29/2020   ALT 19 01/26/2021    AST 18 01/26/2021   NA 136 01/26/2021   K 4.2 01/26/2021   CL 103 01/26/2021   CREATININE 0.73 01/26/2021   BUN 13 01/26/2021   CO2 25 01/26/2021   TSH 1.83 01/26/2021    US Abdomen Complete  Result Date: 02/07/2021 CLINICAL DATA:  Upper abdominal pain. Nausea, fever, vomiting episode EXAM: ABDOMEN ULTRASOUND COMPLETE COMPARISON:  None. FINDINGS: Gallbladder: Calcified stone within the gallbladder lumen measuring up to at least 2 cm. No wall thickening or pericholecystic fluid visualized. No sonographic Murphy sign noted by sonographer. Common bile duct: Diameter: 3 mm. Liver: No focal lesion identified. Increased parenchymal echogenicity. Portal vein is patent on color Doppler imaging with normal direction of blood flow towards the liver. IVC: No abnormality visualized. Pancreas: Visualized portion unremarkable. Spleen: Size and appearance within normal limits. Right Kidney: Length: 11.3 cm. Echogenicity within normal limits. No mass or hydronephrosis visualized. Left Kidney: Length: 10.2 cm. Echogenicity within normal limits. There is a 1.4 x 1 x 1.3 cm lesion within the inferior pole of the left kidney no that is poorly visualized and possibly solid. No hydronephrosis visualized. Abdominal aorta: No aneurysm visualized. Other findings: None. IMPRESSION: 1. Cholelithiasis with no findings of acute cholecystitis. 2. Hepatic steatosis. Please note limited evaluation for focal hepatic masses in a patient with hepatic steatosis due to decreased penetration of the acoustic ultrasound waves. 3. A 1.4 x 1 x 1.3 cm left inferior renal pole lesion that may be solid and that is incompletely evaluated. Recommend correlation with prior cross-sectional imaging. If clinically indicated, consider MRI renal protocol for further evaluation. Electronically Signed   By: Tish Frederickson M.D.   On: 02/07/2021 20:35    Assessment & Plan:      Sonda Primes, MD

## 2021-02-09 NOTE — Assessment & Plan Note (Signed)
8/22 abdominal ultrasound with a 1.4 x 1 x 1.3 cm left inferior renal pole lesion that may be solid and that is incompletely evaluated. Recommend correlation with prior cross-sectional imaging. If clinically indicated, consider MRI renal protocol for further evaluation.  I think it is a simple cyst and we can  follow up on it with another ultrasound.  The patient will let me know if she would like to get an MRI.Marland Kitchen

## 2021-02-09 NOTE — Assessment & Plan Note (Addendum)
2 cm GS in the gallbladder by ultrasound Symptomatic - 2 previous attacks of abdominal pain, nausea and fever Discussed with the patient.  Will obtain a surgical consultation.

## 2021-03-22 ENCOUNTER — Ambulatory Visit: Payer: Self-pay | Admitting: Surgery

## 2021-03-22 DIAGNOSIS — N289 Disorder of kidney and ureter, unspecified: Secondary | ICD-10-CM | POA: Diagnosis not present

## 2021-03-22 DIAGNOSIS — R1013 Epigastric pain: Secondary | ICD-10-CM | POA: Insufficient documentation

## 2021-03-22 DIAGNOSIS — N2889 Other specified disorders of kidney and ureter: Secondary | ICD-10-CM | POA: Diagnosis not present

## 2021-03-22 DIAGNOSIS — K8020 Calculus of gallbladder without cholecystitis without obstruction: Secondary | ICD-10-CM | POA: Diagnosis not present

## 2021-03-22 NOTE — H&P (Signed)
History of Present Illness: Sue Watkins is a 50 y.o. female who was referred to me for evaluation of cholelithiasis.  A few weeks ago she had an episode of epigastric abdominal pain that radiated across the upper abdomen.  She endorses fevers and chills during the episode, but no jaundice.  This happened after she ate some greasy food.  It was also associated with diarrhea.  She reports that she has intermittently had some upper abdominal discomfort over the last 2 years.  Some of it has been associated with certain movements, but some of it occurs after eating.  She saw her PCP and labs and a right upper quadrant ultrasound were performed.  Her LFTs and lipase were normal.  H. pylori antigen was negative.  The ultrasound showed a large stone within the gallbladder, but no signs of acute cholecystitis.  She also incidentally had an incompletely visualized lesion on the left kidney.   She is otherwise in good health and has not had any prior abdominal surgeries.     Review of Systems: A complete review of systems was obtained from the patient.  I have reviewed this information and discussed as appropriate with the patient.  See HPI as well for other ROS.       Medical History: Past Medical History History reviewed. No pertinent past medical history.    Patient Active Problem List Diagnosis  Calculus of gallbladder without cholecystitis without obstruction  Epigastric pain     Past Surgical History History reviewed. No pertinent surgical history.     Allergies No Known Allergies    No current outpatient medications on file prior to visit.   No current facility-administered medications on file prior to visit.     Family History Family History Problem Relation Age of Onset  High blood pressure (Hypertension) Mother    Coronary Artery Disease (Blocked arteries around heart) Mother    Diabetes Mother    Diabetes Sister    High blood pressure (Hypertension) Sister     Obesity Sister        Social History   Tobacco Use Smoking Status Never Smoker Smokeless Tobacco Never Used     Social History Social History    Socioeconomic History  Marital status: Unknown Tobacco Use  Smoking status: Never Smoker  Smokeless tobacco: Never Used Substance and Sexual Activity  Alcohol use: Yes      Objective:     Vitals:   03/22/21 1511 BP: 110/60 Pulse: 73 Temp: 36.7 C (98 F) SpO2: 99% Weight: 67 kg (147 lb 12.8 oz) Height: 154.9 cm (5\' 1" )   Body mass index is 27.93 kg/m.   Physical Exam Vitals reviewed.  Constitutional:      General: She is not in acute distress.    Appearance: Normal appearance.  HENT:     Head: Normocephalic.  Eyes:     General: No scleral icterus.    Conjunctiva/sclera: Conjunctivae normal.  Cardiovascular:     Rate and Rhythm: Normal rate and regular rhythm.     Heart sounds: No murmur heard. Pulmonary:     Effort: Pulmonary effort is normal. No respiratory distress.     Breath sounds: Normal breath sounds. No wheezing.  Abdominal:     General: There is no distension.     Palpations: Abdomen is soft.     Tenderness: There is no abdominal tenderness.     Comments: Soft, nondistended, nontender, no surgical scars  Musculoskeletal:        General: No swelling  or deformity. Normal range of motion.     Cervical back: Normal range of motion.  Skin:    General: Skin is warm and dry.     Coloration: Skin is not jaundiced.  Neurological:     General: No focal deficit present.     Mental Status: She is alert and oriented to person, place, and time.  Psychiatric:        Mood and Affect: Mood normal.        Behavior: Behavior normal.        Thought Content: Thought content normal.            Assessment and Plan: Diagnoses and all orders for this visit:   Calculus of gallbladder without cholecystitis without obstruction -     CCS Case Posting Request; Future   Kidney lesion, native, left -     CT  abdomen pelvis with and without contrast   Other specified disorders of kidney and ureter -     CT abdomen pelvis with and without contrast       This is a 50 year old female with intermittent epigastric abdominal pain following meals.  I personally reviewed her labs and her ultrasound, which shows a large stone within the gallbladder.  There is no wall thickening or pericholecystic fluid to suggest cholecystitis.  I do think that some of her symptoms are consistent with biliary colic. Laparoscopic cholecystectomy was recommended. The details of this procedure were discussed with the patient. She expressed understanding and agrees to proceed with surgery.  She asked many excellent questions about the surgery and about potential alternative treatments.  I discussed that cholestyramine is sometimes used to try to prevent stone formation but has not been proven to reliably minimize symptoms of biliary colic.  Given the size of her stone and her current symptoms, I discussed that the are not standard of care is cholecystectomy to definitively remove the source of the stones.  She expressed understanding and would like to proceed with surgery.  You will be contacted to schedule an elective surgery date.  I have also ordered a renal protocol CT scan to completely evaluate the incidental left renal lesion noted on her ultrasound, as she has not yet had any further work-up of this.  Sophronia Simas, MD [facility] Surgery General, Hepatobiliary and Pancreatic Surgery 03/22/21 4:04 PM

## 2021-03-22 NOTE — H&P (View-Only) (Signed)
History of Present Illness: Sue Watkins is a 50 y.o. female who was referred to me for evaluation of cholelithiasis.  A few weeks ago she had an episode of epigastric abdominal pain that radiated across the upper abdomen.  She endorses fevers and chills during the episode, but no jaundice.  This happened after she ate some greasy food.  It was also associated with diarrhea.  She reports that she has intermittently had some upper abdominal discomfort over the last 2 years.  Some of it has been associated with certain movements, but some of it occurs after eating.  She saw her PCP and labs and a right upper quadrant ultrasound were performed.  Her LFTs and lipase were normal.  H. pylori antigen was negative.  The ultrasound showed a large stone within the gallbladder, but no signs of acute cholecystitis.  She also incidentally had an incompletely visualized lesion on the left kidney.   She is otherwise in good health and has not had any prior abdominal surgeries.     Review of Systems: A complete review of systems was obtained from the patient.  I have reviewed this information and discussed as appropriate with the patient.  See HPI as well for other ROS.       Medical History: Past Medical History History reviewed. No pertinent past medical history.    Patient Active Problem List Diagnosis  Calculus of gallbladder without cholecystitis without obstruction  Epigastric pain     Past Surgical History History reviewed. No pertinent surgical history.     Allergies No Known Allergies    No current outpatient medications on file prior to visit.   No current facility-administered medications on file prior to visit.     Family History Family History Problem Relation Age of Onset  High blood pressure (Hypertension) Mother    Coronary Artery Disease (Blocked arteries around heart) Mother    Diabetes Mother    Diabetes Sister    High blood pressure (Hypertension) Sister     Obesity Sister        Social History   Tobacco Use Smoking Status Never Smoker Smokeless Tobacco Never Used     Social History Social History    Socioeconomic History  Marital status: Unknown Tobacco Use  Smoking status: Never Smoker  Smokeless tobacco: Never Used Substance and Sexual Activity  Alcohol use: Yes      Objective:     Vitals:   03/22/21 1511 BP: 110/60 Pulse: 73 Temp: 36.7 C (98 F) SpO2: 99% Weight: 67 kg (147 lb 12.8 oz) Height: 154.9 cm (5\' 1" )   Body mass index is 27.93 kg/m.   Physical Exam Vitals reviewed.  Constitutional:      General: She is not in acute distress.    Appearance: Normal appearance.  HENT:     Head: Normocephalic.  Eyes:     General: No scleral icterus.    Conjunctiva/sclera: Conjunctivae normal.  Cardiovascular:     Rate and Rhythm: Normal rate and regular rhythm.     Heart sounds: No murmur heard. Pulmonary:     Effort: Pulmonary effort is normal. No respiratory distress.     Breath sounds: Normal breath sounds. No wheezing.  Abdominal:     General: There is no distension.     Palpations: Abdomen is soft.     Tenderness: There is no abdominal tenderness.     Comments: Soft, nondistended, nontender, no surgical scars  Musculoskeletal:        General: No swelling  or deformity. Normal range of motion.     Cervical back: Normal range of motion.  Skin:    General: Skin is warm and dry.     Coloration: Skin is not jaundiced.  Neurological:     General: No focal deficit present.     Mental Status: She is alert and oriented to person, place, and time.  Psychiatric:        Mood and Affect: Mood normal.        Behavior: Behavior normal.        Thought Content: Thought content normal.            Assessment and Plan: Diagnoses and all orders for this visit:   Calculus of gallbladder without cholecystitis without obstruction -     CCS Case Posting Request; Future   Kidney lesion, native, left -     CT  abdomen pelvis with and without contrast   Other specified disorders of kidney and ureter -     CT abdomen pelvis with and without contrast       This is a 50 year old female with intermittent epigastric abdominal pain following meals.  I personally reviewed her labs and her ultrasound, which shows a large stone within the gallbladder.  There is no wall thickening or pericholecystic fluid to suggest cholecystitis.  I do think that some of her symptoms are consistent with biliary colic. Laparoscopic cholecystectomy was recommended. The details of this procedure were discussed with the patient. She expressed understanding and agrees to proceed with surgery.  She asked many excellent questions about the surgery and about potential alternative treatments.  I discussed that cholestyramine is sometimes used to try to prevent stone formation but has not been proven to reliably minimize symptoms of biliary colic.  Given the size of her stone and her current symptoms, I discussed that the are not standard of care is cholecystectomy to definitively remove the source of the stones.  She expressed understanding and would like to proceed with surgery.  You will be contacted to schedule an elective surgery date.  I have also ordered a renal protocol CT scan to completely evaluate the incidental left renal lesion noted on her ultrasound, as she has not yet had any further work-up of this.  Sophronia Simas, MD [facility] Surgery General, Hepatobiliary and Pancreatic Surgery 03/22/21 4:04 PM

## 2021-03-24 ENCOUNTER — Other Ambulatory Visit: Payer: Self-pay | Admitting: Surgery

## 2021-03-24 DIAGNOSIS — N289 Disorder of kidney and ureter, unspecified: Secondary | ICD-10-CM

## 2021-03-24 DIAGNOSIS — N2889 Other specified disorders of kidney and ureter: Secondary | ICD-10-CM

## 2021-04-13 ENCOUNTER — Encounter (HOSPITAL_COMMUNITY): Payer: Self-pay | Admitting: Surgery

## 2021-04-13 NOTE — Progress Notes (Signed)
Spoke with pt for pre-op call. Pt denies cardiac hx, Diabetes or HTN.  Pt's surgery is scheduled as ambulatory so no Covid test is required prior to surgery.

## 2021-04-13 NOTE — Progress Notes (Signed)
Called Dr. Eliot Ford office and spoke with Mosetta Anis, one of the nurses at the office. I asked Shamir to check with Dr. Freida Busman to see if pt can have clear liquids up to 3 hours prior to surgery since pt's surgery does not start until 2:30 PM. Shamir called me back stating that Dr. Freida Busman ok'ed the clear liquids up to 3 hours prior to surgery.

## 2021-04-14 ENCOUNTER — Other Ambulatory Visit: Payer: Self-pay

## 2021-04-14 ENCOUNTER — Ambulatory Visit (HOSPITAL_COMMUNITY): Payer: BC Managed Care – PPO | Admitting: Certified Registered Nurse Anesthetist

## 2021-04-14 ENCOUNTER — Encounter (HOSPITAL_COMMUNITY): Payer: Self-pay | Admitting: Surgery

## 2021-04-14 ENCOUNTER — Ambulatory Visit (HOSPITAL_COMMUNITY)
Admission: RE | Admit: 2021-04-14 | Discharge: 2021-04-14 | Disposition: A | Discharge status: Home / Self Care | Payer: BC Managed Care – PPO | Attending: Surgery | Admitting: Surgery

## 2021-04-14 ENCOUNTER — Encounter (HOSPITAL_COMMUNITY)
Admission: RE | Disposition: A | Discharge status: Home / Self Care | Payer: Self-pay | Source: Home / Self Care | Attending: Surgery

## 2021-04-14 DIAGNOSIS — K8010 Calculus of gallbladder with chronic cholecystitis without obstruction: Secondary | ICD-10-CM | POA: Diagnosis not present

## 2021-04-14 DIAGNOSIS — K219 Gastro-esophageal reflux disease without esophagitis: Secondary | ICD-10-CM | POA: Insufficient documentation

## 2021-04-14 DIAGNOSIS — Z8711 Personal history of peptic ulcer disease: Secondary | ICD-10-CM | POA: Insufficient documentation

## 2021-04-14 DIAGNOSIS — D649 Anemia, unspecified: Secondary | ICD-10-CM | POA: Diagnosis not present

## 2021-04-14 DIAGNOSIS — N289 Disorder of kidney and ureter, unspecified: Secondary | ICD-10-CM | POA: Insufficient documentation

## 2021-04-14 DIAGNOSIS — J309 Allergic rhinitis, unspecified: Secondary | ICD-10-CM | POA: Diagnosis not present

## 2021-04-14 DIAGNOSIS — K8020 Calculus of gallbladder without cholecystitis without obstruction: Secondary | ICD-10-CM

## 2021-04-14 HISTORY — DX: Inflammatory liver disease, unspecified: K75.9

## 2021-04-14 HISTORY — PX: CHOLECYSTECTOMY: SHX55

## 2021-04-14 HISTORY — DX: Helicobacter pylori (H. pylori) as the cause of diseases classified elsewhere: B96.81

## 2021-04-14 LAB — POCT PREGNANCY, URINE: Preg Test, Ur: NEGATIVE

## 2021-04-14 LAB — COMPREHENSIVE METABOLIC PANEL
ALT: 21 U/L (ref 0–44)
AST: 20 U/L (ref 15–41)
Albumin: 4.2 g/dL (ref 3.5–5.0)
Alkaline Phosphatase: 53 U/L (ref 38–126)
Anion gap: 7 (ref 5–15)
BUN: 11 mg/dL (ref 6–20)
CO2: 25 mmol/L (ref 22–32)
Calcium: 9.4 mg/dL (ref 8.9–10.3)
Chloride: 103 mmol/L (ref 98–111)
Creatinine, Ser: 0.68 mg/dL (ref 0.44–1.00)
GFR, Estimated: >60 mL/min (ref >60)
Glucose, Bld: 91 mg/dL (ref 70–99)
Potassium: 3.6 mmol/L (ref 3.5–5.1)
Sodium: 135 mmol/L (ref 135–145)
Total Bilirubin: 0.8 mg/dL (ref 0.3–1.2)
Total Protein: 7.7 g/dL (ref 6.5–8.1)

## 2021-04-14 LAB — CBC
HCT: 43.9 % (ref 36.0–46.0)
Hemoglobin: 14.3 g/dL (ref 12.0–15.0)
MCH: 30.2 pg (ref 26.0–34.0)
MCHC: 32.6 g/dL (ref 30.0–36.0)
MCV: 92.8 fL (ref 80.0–100.0)
Platelets: 222 10*3/uL (ref 150–400)
RBC: 4.73 MIL/uL (ref 3.87–5.11)
RDW: 12.3 % (ref 11.5–15.5)
WBC: 7.6 10*3/uL (ref 4.0–10.5)
nRBC: 0 % (ref 0.0–0.2)

## 2021-04-14 SURGERY — LAPAROSCOPIC CHOLECYSTECTOMY
Anesthesia: General

## 2021-04-14 MED ORDER — MIDAZOLAM HCL 2 MG/2ML IJ SOLN
INTRAMUSCULAR | Status: AC
  Filled 2021-04-14: qty 2

## 2021-04-14 MED ORDER — DEXAMETHASONE SODIUM PHOSPHATE 10 MG/ML IJ SOLN
INTRAMUSCULAR | Status: DC | PRN
  Administered 2021-04-14: 10 mg via INTRAVENOUS

## 2021-04-14 MED ORDER — FENTANYL CITRATE (PF) 250 MCG/5ML IJ SOLN
INTRAMUSCULAR | Status: AC
  Filled 2021-04-14: qty 5

## 2021-04-14 MED ORDER — SUGAMMADEX SODIUM 200 MG/2ML IV SOLN
INTRAVENOUS | Status: DC | PRN
  Administered 2021-04-14: 200 mg via INTRAVENOUS

## 2021-04-14 MED ORDER — DEXMEDETOMIDINE (PRECEDEX) IN NS 20 MCG/5ML (4 MCG/ML) IV SYRINGE
PREFILLED_SYRINGE | INTRAVENOUS | Status: DC | PRN
  Administered 2021-04-14: 8 ug via INTRAVENOUS

## 2021-04-14 MED ORDER — ACETAMINOPHEN 500 MG PO TABS: 1000.0000 mg | ORAL_TABLET | ORAL | Status: AC

## 2021-04-14 MED ORDER — ORAL CARE MOUTH RINSE: 15.0000 mL | Freq: Once | OROMUCOSAL | Status: AC

## 2021-04-14 MED ORDER — FENTANYL CITRATE (PF) 250 MCG/5ML IJ SOLN
INTRAMUSCULAR | Status: DC | PRN
  Administered 2021-04-14 (×4): 50 ug via INTRAVENOUS

## 2021-04-14 MED ORDER — CHLORHEXIDINE GLUCONATE 0.12 % MT SOLN: 15.0000 mL | Freq: Once | OROMUCOSAL | Status: AC

## 2021-04-14 MED ORDER — BUPIVACAINE-EPINEPHRINE 0.25% -1:200000 IJ SOLN
INTRAMUSCULAR | Status: DC | PRN
  Administered 2021-04-14: 20 mL

## 2021-04-14 MED ORDER — LACTATED RINGERS IV SOLN: INTRAVENOUS | Status: DC

## 2021-04-14 MED ORDER — HYDROCODONE-ACETAMINOPHEN 5-325 MG PO TABS: 1.0000 | ORAL_TABLET | Freq: Four times a day (QID) | ORAL | 0 refills | Status: DC | PRN

## 2021-04-14 MED ORDER — ONDANSETRON HCL 4 MG/2ML IJ SOLN
INTRAMUSCULAR | Status: DC | PRN
  Administered 2021-04-14: 4 mg via INTRAVENOUS

## 2021-04-14 MED ORDER — CEFAZOLIN SODIUM-DEXTROSE 2-4 GM/100ML-% IV SOLN
2.0000 g | INTRAVENOUS | Status: AC
  Administered 2021-04-14: 2 g via INTRAVENOUS

## 2021-04-14 MED ORDER — ROCURONIUM BROMIDE 10 MG/ML (PF) SYRINGE
PREFILLED_SYRINGE | INTRAVENOUS | Status: DC | PRN
  Administered 2021-04-14: 20 mg via INTRAVENOUS
  Administered 2021-04-14: 60 mg via INTRAVENOUS

## 2021-04-14 MED ORDER — PHENYLEPHRINE 40 MCG/ML (10ML) SYRINGE FOR IV PUSH (FOR BLOOD PRESSURE SUPPORT)
PREFILLED_SYRINGE | INTRAVENOUS | Status: DC | PRN
  Administered 2021-04-14: 80 ug via INTRAVENOUS

## 2021-04-14 MED ORDER — GABAPENTIN 300 MG PO CAPS: 300.0000 mg | ORAL_CAPSULE | ORAL | Status: AC

## 2021-04-14 MED ORDER — LIDOCAINE 2% (20 MG/ML) 5 ML SYRINGE
INTRAMUSCULAR | Status: DC | PRN
  Administered 2021-04-14: 80 mg via INTRAVENOUS

## 2021-04-14 MED ORDER — FENTANYL CITRATE (PF) 100 MCG/2ML IJ SOLN
INTRAMUSCULAR | Status: AC
  Filled 2021-04-14: qty 2

## 2021-04-14 MED ORDER — MIDAZOLAM HCL 2 MG/2ML IJ SOLN
INTRAMUSCULAR | Status: DC | PRN
  Administered 2021-04-14: 2 mg via INTRAVENOUS

## 2021-04-14 MED ORDER — ACETAMINOPHEN 500 MG PO TABS
ORAL_TABLET | ORAL | Status: AC
  Administered 2021-04-14: 1000 mg via ORAL
  Filled 2021-04-14: qty 2

## 2021-04-14 MED ORDER — PROPOFOL 10 MG/ML IV BOLUS
INTRAVENOUS | Status: DC | PRN
  Administered 2021-04-14: 160 mg via INTRAVENOUS

## 2021-04-14 MED ORDER — CEFAZOLIN SODIUM-DEXTROSE 2-4 GM/100ML-% IV SOLN
INTRAVENOUS | Status: AC
  Filled 2021-04-14: qty 100

## 2021-04-14 MED ORDER — PROPOFOL 10 MG/ML IV BOLUS
INTRAVENOUS | Status: AC
  Filled 2021-04-14: qty 20

## 2021-04-14 MED ORDER — GABAPENTIN 300 MG PO CAPS
ORAL_CAPSULE | ORAL | Status: AC
  Administered 2021-04-14: 300 mg via ORAL
  Filled 2021-04-14: qty 1

## 2021-04-14 MED ORDER — FENTANYL CITRATE (PF) 100 MCG/2ML IJ SOLN
25.0000 ug | INTRAMUSCULAR | Status: DC | PRN
  Administered 2021-04-14 (×2): 25 ug via INTRAVENOUS

## 2021-04-14 MED ORDER — CHLORHEXIDINE GLUCONATE 0.12 % MT SOLN
OROMUCOSAL | Status: AC
  Administered 2021-04-14: 15 mL via OROMUCOSAL
  Filled 2021-04-14: qty 15

## 2021-04-14 MED ORDER — BUPIVACAINE-EPINEPHRINE (PF) 0.25% -1:200000 IJ SOLN
INTRAMUSCULAR | Status: AC
  Filled 2021-04-14: qty 30

## 2021-04-14 SURGICAL SUPPLY — 40 items
APPLIER CLIP 5 13 M/L LIGAMAX5 (MISCELLANEOUS) ×2
BAG COUNTER SPONGE SURGICOUNT (BAG) ×2 IMPLANT
BLADE CLIPPER SURG (BLADE) IMPLANT
CANISTER SUCT 3000ML PPV (MISCELLANEOUS) ×2 IMPLANT
CHLORAPREP W/TINT 26 (MISCELLANEOUS) ×2 IMPLANT
CLIP APPLIE 5 13 M/L LIGAMAX5 (MISCELLANEOUS) ×1 IMPLANT
COVER SURGICAL LIGHT HANDLE (MISCELLANEOUS) ×2 IMPLANT
DERMABOND ADVANCED (GAUZE/BANDAGES/DRESSINGS) ×1
DERMABOND ADVANCED .7 DNX12 (GAUZE/BANDAGES/DRESSINGS) ×1 IMPLANT
ELECT REM PT RETURN 9FT ADLT (ELECTROSURGICAL) ×2
ELECTRODE REM PT RTRN 9FT ADLT (ELECTROSURGICAL) ×1 IMPLANT
GLOVE SURG POLY MICRO LF SZ5.5 (GLOVE) ×2 IMPLANT
GLOVE SURG UNDER POLY LF SZ6 (GLOVE) ×2 IMPLANT
GOWN STRL REUS W/ TWL LRG LVL3 (GOWN DISPOSABLE) ×3 IMPLANT
GOWN STRL REUS W/TWL LRG LVL3 (GOWN DISPOSABLE) ×3
KIT BASIN OR (CUSTOM PROCEDURE TRAY) ×2 IMPLANT
KIT TURNOVER KIT B (KITS) ×2 IMPLANT
L-HOOK LAP DISP 36CM (ELECTROSURGICAL) ×2
LHOOK LAP DISP 36CM (ELECTROSURGICAL) ×1 IMPLANT
NEEDLE INSUFFLATION 14GA 120MM (NEEDLE) IMPLANT
NS IRRIG 1000ML POUR BTL (IV SOLUTION) ×2 IMPLANT
PAD ARMBOARD 7.5X6 YLW CONV (MISCELLANEOUS) ×2 IMPLANT
PENCIL BUTTON HOLSTER BLD 10FT (ELECTRODE) ×2 IMPLANT
POUCH SPECIMEN RETRIEVAL 10MM (ENDOMECHANICALS) ×2 IMPLANT
SCISSORS LAP 5X35 DISP (ENDOMECHANICALS) ×2 IMPLANT
SET IRRIG TUBING LAPAROSCOPIC (IRRIGATION / IRRIGATOR) ×2 IMPLANT
SET TUBE SMOKE EVAC HIGH FLOW (TUBING) ×2 IMPLANT
SLEEVE ENDOPATH XCEL 5M (ENDOMECHANICALS) ×4 IMPLANT
SPECIMEN JAR SMALL (MISCELLANEOUS) ×4 IMPLANT
SUT MNCRL AB 4-0 PS2 18 (SUTURE) ×4 IMPLANT
SUT VIC AB 3-0 SH 27 (SUTURE) ×1
SUT VIC AB 3-0 SH 27XBRD (SUTURE) ×1 IMPLANT
SUT VICRYL 0 UR6 27IN ABS (SUTURE) ×2 IMPLANT
TOWEL GREEN STERILE (TOWEL DISPOSABLE) ×2 IMPLANT
TOWEL GREEN STERILE FF (TOWEL DISPOSABLE) ×2 IMPLANT
TRAY LAPAROSCOPIC MC (CUSTOM PROCEDURE TRAY) ×2 IMPLANT
TROCAR XCEL 12X100 BLDLESS (ENDOMECHANICALS) IMPLANT
TROCAR XCEL BLUNT TIP 100MML (ENDOMECHANICALS) ×2 IMPLANT
TROCAR XCEL NON-BLD 5MMX100MML (ENDOMECHANICALS) ×2 IMPLANT
WATER STERILE IRR 1000ML POUR (IV SOLUTION) ×2 IMPLANT

## 2021-04-14 NOTE — Transfer of Care (Signed)
Immediate Anesthesia Transfer of Care Note  Patient: Sue Watkins  Procedure(s) Performed: LAPAROSCOPIC CHOLECYSTECTOMY  Patient Location: PACU  Anesthesia Type:General  Level of Consciousness: awake, alert  and oriented  Airway & Oxygen Therapy: Patient Spontanous Breathing  Post-op Assessment: Report given to RN and Post -op Vital signs reviewed and stable  Post vital signs: Reviewed and stable  Last Vitals:  Vitals Value Taken Time  BP 133/71 04/14/21 1546  Temp    Pulse 96 04/14/21 1548  Resp 27 04/14/21 1546  SpO2 98 % 04/14/21 1548  Vitals shown include unvalidated device data.  Last Pain:  Vitals:   04/14/21 1305  TempSrc:   PainSc: 0-No pain         Complications: No notable events documented.

## 2021-04-14 NOTE — Interval H&P Note (Signed)
History and Physical Interval Note:  04/14/2021 1:23 PM  Sue Watkins  has presented today for surgery, with the diagnosis of GALLSTONES.  The various methods of treatment have been discussed with the patient and family. After consideration of risks, benefits and other options for treatment, the patient has consented to  Procedure(s): LAPAROSCOPIC CHOLECYSTECTOMY (N/A) as a surgical intervention.  The patient's history has been reviewed, patient examined, no change in status, stable for surgery.  I have reviewed the patient's chart and labs.  Questions were answered to the patient's satisfaction.  Plan for discharge home from PACU.   Fritzi Mandes

## 2021-04-14 NOTE — Anesthesia Preprocedure Evaluation (Signed)
Anesthesia Evaluation  Patient identified by MRN, date of birth, ID band Patient awake    Reviewed: Allergy & Precautions, NPO status   Airway Mallampati: II  TM Distance: >3 FB     Dental   Pulmonary neg pulmonary ROS,    breath sounds clear to auscultation       Cardiovascular negative cardio ROS   Rhythm:Regular Rate:Normal     Neuro/Psych negative neurological ROS     GI/Hepatic PUD, GERD  ,(+) Hepatitis -  Endo/Other    Renal/GU Renal disease     Musculoskeletal   Abdominal   Peds  Hematology   Anesthesia Other Findings   Reproductive/Obstetrics                             Anesthesia Physical Anesthesia Plan  ASA: 2  Anesthesia Plan: General   Post-op Pain Management:    Induction: Intravenous  PONV Risk Score and Plan: 3 and Ondansetron, Dexamethasone and Midazolam  Airway Management Planned: Oral ETT  Additional Equipment:   Intra-op Plan:   Post-operative Plan: Extubation in OR  Informed Consent: I have reviewed the patients History and Physical, chart, labs and discussed the procedure including the risks, benefits and alternatives for the proposed anesthesia with the patient or authorized representative who has indicated his/her understanding and acceptance.     Dental advisory given  Plan Discussed with: CRNA and Anesthesiologist  Anesthesia Plan Comments:         Anesthesia Quick Evaluation

## 2021-04-14 NOTE — Op Note (Signed)
Date: 04/14/21  Patient: Sue Watkins MRN: [id number]  Preoperative Diagnosis: Symptomatic cholelithiasis Postoperative Diagnosis: Same  Procedure: Laparoscopic cholecystectomy  Surgeon: Sophronia Simas, MD Assistant: Basilio Cairo, MD (Resident)  EBL: Minimal  Anesthesia: General endotracheal  Specimens: Gallbladder  Indications: Ms. Sacra is a 50 yo female who presented with intermittent epigastric abdominal pain. RUQ US showed cholelithiasis without evidence of acute cholecystitis.  After a discussion of the risks and benefits of surgery, she agreed to proceed with cholecystectomy.  Findings: Multiple large stones within the lumen of the gallbladder.  No evidence of acute cholecystitis.  Procedure details: Informed consent was obtained in the preoperative area prior to the procedure. The patient was brought to the operating room and placed on the table in the supine position.  General anesthesia was induced and appropriate lines and drains were placed for intraoperative monitoring. Perioperative antibiotics were administered per SCIP guidelines. The abdomen was prepped and draped in the usual sterile fashion. A pre-procedure timeout was taken verifying patient identity, surgical site and procedure to be performed.  A small infraumbilical skin incision was made, the subcutaneous tissue was divided with cautery, and the umbilical stalk was grasped and elevated. The fascia was incised and the peritoneal cavity was directly visualized. A 10mm Hassan trocar was placed. The peritoneal cavity was inspected with no evidence of visceral or vascular injury. Three 100mm ports were placed in the right subcostal margin, all under direct visualization. The fundus of the gallbladder was grasped and retracted cephalad.  There were minimal omental adhesions to the gallbladder which were taken down carefully using cautery.  The infundibulum was retracted laterally. The cystic triangle was dissected out  using cautery and blunt dissection, and the critical view of safety was obtained. The cystic duct and cystic artery were clipped and ligated, leaving 2 clips behind on the cystic duct stump. The gallbladder was taken off the liver using cautery.  A posterior branch of the cystic duct was encountered and was also clipped prior to division with cautery.  The specimen was completely taken off the liver, and then placed in an endocatch bag and removed. The surgical site was irrigated with saline until the effluent was clear. Hemostasis was achieved in the gallbladder fossa using cautery. The cystic duct and artery stumps were visually inspected and there was no evidence of bile leak or bleeding. The ports were removed under direct visualization and the abdomen was desufflated. The umbilical port site fascia was closed with a 0 vicryl suture. The skin at all port sites was closed with 4-0 monocryl subcuticular suture. Dermabond was applied.  The patient tolerated the procedure with no apparent complications.  All counts were correct x2 at the end of the procedure. The patient was extubated and taken to PACU in stable condition.  Sophronia Simas, MD 04/14/21 3:42 PM

## 2021-04-14 NOTE — Anesthesia Postprocedure Evaluation (Signed)
Anesthesia Post Note  Patient: Sue Watkins  Procedure(s) Performed: LAPAROSCOPIC CHOLECYSTECTOMY     Patient location during evaluation: PACU Anesthesia Type: General Level of consciousness: sedated Pain management: pain level controlled Vital Signs Assessment: post-procedure vital signs reviewed and stable Respiratory status: spontaneous breathing and respiratory function stable Cardiovascular status: stable Postop Assessment: no apparent nausea or vomiting Anesthetic complications: no   No notable events documented.  Last Vitals:  Vitals:   04/14/21 1615 04/14/21 1630  BP: 130/74 133/80  Pulse: 83 72  Resp: 18 19  Temp:    SpO2: 100% 100%    Last Pain:  Vitals:   04/14/21 1630  TempSrc:   PainSc: 4                  Derrius Furtick DANIEL

## 2021-04-14 NOTE — Anesthesia Procedure Notes (Signed)
Procedure Name: Intubation Date/Time: 04/14/2021 2:22 PM Performed by: Dorthea Cove, CRNA Pre-anesthesia Checklist: Patient identified, Emergency Drugs available, Suction available and Patient being monitored Patient Re-evaluated:Patient Re-evaluated prior to induction Oxygen Delivery Method: Circle system utilized Preoxygenation: Pre-oxygenation with 100% oxygen Induction Type: IV induction Ventilation: Mask ventilation without difficulty Laryngoscope Size: Mac and 3 Grade View: Grade I Tube type: Oral Tube size: 7.0 mm Number of attempts: 1 Airway Equipment and Method: Stylet and Oral airway Placement Confirmation: ETT inserted through vocal cords under direct vision, positive ETCO2 and breath sounds checked- equal and bilateral Secured at: 22 cm Tube secured with: Tape Dental Injury: Teeth and Oropharynx as per pre-operative assessment

## 2021-04-14 NOTE — Discharge Instructions (Addendum)
[facility] [place] [facility] DISCHARGE INSTRUCTIONS  Activity No heavy lifting greater than 15 pounds for 4 weeks after surgery. Ok to shower in 24 hours, but do not bathe or submerge incisions underwater. Do not drive while taking narcotic pain medication.  Wound Care Your incisions are covered with skin glue called Dermabond. This will peel off on its own over time. You may shower 24 hours after surgery and allow warm soapy water to run over your incisions. Gently pat dry. Do not submerge your incision underwater. Monitor your incision for any new redness, tenderness, or drainage.  When to Call us: Fever greater than 100.5 New redness, drainage, or swelling at incision site Severe pain, nausea, or vomiting Jaundice (yellowing of the whites of the eyes or skin)  Follow-up You have an appointment scheduled with Dr. Freida Busman on May 02, 2021 at 9:30am. This will be at the [facility] Surgery office at [address], [address], [place], [place]. Please arrive at least 15 minutes prior to your scheduled appointment time.  For questions or concerns, please call the office at [phone].

## 2021-04-15 ENCOUNTER — Encounter (HOSPITAL_COMMUNITY): Payer: Self-pay | Admitting: Surgery

## 2021-04-18 LAB — SURGICAL PATHOLOGY

## 2021-04-25 ENCOUNTER — Encounter (HOSPITAL_COMMUNITY): Payer: Self-pay | Admitting: Surgery

## 2021-04-25 MED ORDER — SODIUM CHLORIDE 0.9 % IR SOLN
Status: DC | PRN
  Administered 2021-04-14: 1000 mL

## 2021-04-25 MED ORDER — 0.9 % SODIUM CHLORIDE (POUR BTL) OPTIME
TOPICAL | Status: DC | PRN
  Administered 2021-04-14: 1000 mL

## 2021-09-18 ENCOUNTER — Other Ambulatory Visit: Payer: Self-pay

## 2021-09-18 ENCOUNTER — Encounter: Payer: Self-pay | Admitting: Internal Medicine

## 2021-09-18 ENCOUNTER — Ambulatory Visit (INDEPENDENT_AMBULATORY_CARE_PROVIDER_SITE_OTHER): Payer: BC Managed Care – PPO | Admitting: Internal Medicine

## 2021-09-18 VITALS — BP 100/78 | HR 86 | Temp 98.2°F | Ht 62.0 in | Wt 146.0 lb

## 2021-09-18 DIAGNOSIS — Z0000 Encounter for general adult medical examination without abnormal findings: Secondary | ICD-10-CM | POA: Diagnosis not present

## 2021-09-18 DIAGNOSIS — N281 Cyst of kidney, acquired: Secondary | ICD-10-CM

## 2021-09-18 DIAGNOSIS — K8020 Calculus of gallbladder without cholecystitis without obstruction: Secondary | ICD-10-CM

## 2021-09-18 NOTE — Progress Notes (Signed)
? ?Subjective:  ?Patient ID: Sue Watkins, female    DOB: 11/09/1970  Age: 51 y.o. MRN: [id number] ? ?CC: No chief complaint on file. ? ? ?HPI ?Sue Watkins presents for a well exam ? ?Outpatient Medications Prior to Visit  ?Medication Sig Dispense Refill  ? fluticasone (FLONASE) 50 MCG/ACT nasal spray Place 2 sprays into both nostrils daily. (Patient not taking: Reported on 04/13/2021) 16 g 1  ? HYDROcodone-acetaminophen (NORCO/VICODIN) 5-325 MG tablet Take 1 tablet by mouth every 6 (six) hours as needed for severe pain. 15 tablet 0  ? loratadine (CLARITIN) 10 MG tablet Take 1 tablet (10 mg total) by mouth daily. (Patient not taking: Reported on 04/13/2021) 30 tablet 11  ? ondansetron (ZOFRAN) 4 MG tablet Take 1 tablet (4 mg total) by mouth every 8 (eight) hours as needed for nausea or vomiting. (Patient not taking: Reported on 04/13/2021) 20 tablet 0  ? pantoprazole (PROTONIX) 40 MG tablet Take 1 tablet (40 mg total) by mouth daily. (Patient not taking: Reported on 04/13/2021) 30 tablet 3  ? ?No facility-administered medications prior to visit.  ? ? ?ROS: ?Review of Systems  ?Constitutional:  Negative for activity change, appetite change, chills, fatigue and unexpected weight change.  ?HENT:  Negative for congestion, mouth sores and sinus pressure.   ?Eyes:  Negative for visual disturbance.  ?Respiratory:  Negative for cough and chest tightness.   ?Gastrointestinal:  Negative for abdominal pain and nausea.  ?Genitourinary:  Negative for difficulty urinating, frequency and vaginal pain.  ?Musculoskeletal:  Negative for back pain and gait problem.  ?Skin:  Negative for pallor and rash.  ?Neurological:  Negative for dizziness, tremors, weakness, numbness and headaches.  ?Psychiatric/Behavioral:  Negative for confusion and sleep disturbance.   ? ?Objective:  ?BP 100/78 (BP Location: Left Arm, Patient Position: Sitting, Cuff Size: Large)   Pulse 86   Temp 98.2 ?F (36.8 ?C) (Oral)   Ht 5\' 2"  (1.575 m)    Wt 146 lb (66.2 kg)   SpO2 97%   BMI 26.70 kg/m?  ? ?BP Readings from Last 3 Encounters:  ?09/18/21 100/78  ?04/14/21 129/78  ?02/09/21 118/82  ? ? ?Wt Readings from Last 3 Encounters:  ?09/18/21 146 lb (66.2 kg)  ?04/14/21 147 lb (66.7 kg)  ?02/09/21 146 lb (66.2 kg)  ? ? ?Physical Exam ?Constitutional:   ?   General: She is not in acute distress. ?   Appearance: She is well-developed.  ?HENT:  ?   Head: Normocephalic.  ?   Right Ear: External ear normal.  ?   Left Ear: External ear normal.  ?   Nose: Nose normal.  ?Eyes:  ?   General:     ?   Right eye: No discharge.     ?   Left eye: No discharge.  ?   Conjunctiva/sclera: Conjunctivae normal.  ?   Pupils: Pupils are equal, round, and reactive to light.  ?Neck:  ?   Thyroid: No thyromegaly.  ?   Vascular: No JVD.  ?   Trachea: No tracheal deviation.  ?Cardiovascular:  ?   Rate and Rhythm: Normal rate and regular rhythm.  ?   Heart sounds: Normal heart sounds.  ?Pulmonary:  ?   Effort: No respiratory distress.  ?   Breath sounds: No stridor. No wheezing.  ?Abdominal:  ?   General: Bowel sounds are normal. There is no distension.  ?   Palpations: Abdomen is soft. There is no mass.  ?  Tenderness: There is no abdominal tenderness. There is no guarding or rebound.  ?Musculoskeletal:     ?   General: No tenderness.  ?   Cervical back: Normal range of motion and neck supple. No rigidity.  ?Lymphadenopathy:  ?   Cervical: No cervical adenopathy.  ?Skin: ?   Findings: No erythema or rash.  ?Neurological:  ?   Cranial Nerves: No cranial nerve deficit.  ?   Motor: No abnormal muscle tone.  ?   Coordination: Coordination normal.  ?   Deep Tendon Reflexes: Reflexes normal.  ?Psychiatric:     ?   Behavior: Behavior normal.     ?   Thought Content: Thought content normal.     ?   Judgment: Judgment normal.  ? ? ?Lab Results  ?Component Value Date  ? WBC 7.6 04/14/2021  ? HGB 14.3 04/14/2021  ? HCT 43.9 04/14/2021  ? PLT 222 04/14/2021  ? GLUCOSE 91 04/14/2021  ? CHOL 184  06/29/2020  ? TRIG 101.0 06/29/2020  ? HDL 45.30 06/29/2020  ? LDLCALC 119 (H) 06/29/2020  ? ALT 21 04/14/2021  ? AST 20 04/14/2021  ? NA 135 04/14/2021  ? K 3.6 04/14/2021  ? CL 103 04/14/2021  ? CREATININE 0.68 04/14/2021  ? BUN 11 04/14/2021  ? CO2 25 04/14/2021  ? TSH 1.83 01/26/2021  ? ? ?No results found. ? ?Assessment & Plan:  ? ?Problem List Items Addressed This Visit   ? ? Well adult exam - Primary  ?  We discussed age appropriate health related issues, including available/recomended screening tests and vaccinations. We discussed a need for adhering to healthy diet and exercise. Labs were ordered to be later reviewed . All questions were answered. ?  ?  ? Relevant Orders  ? TSH  ? Urinalysis  ? CBC with Differential/Platelet  ? Lipid panel  ? Comprehensive metabolic panel  ? Gallstone  ?  S/p lap chole 03/2021 - Dr Mitzi Davenport ?  ?  ? Renal cyst  ?  Options discussed - will repeat renal US ?  ?  ? Relevant Orders  ? US RENAL  ?  ? ? ?No orders of the defined types were placed in this encounter. ?  ? ? ?Follow-up: Return in about 1 year (around 09/19/2022) for Wellness Exam. ? ?Sonda Primes, MD ?

## 2021-09-18 NOTE — Assessment & Plan Note (Signed)
We discussed age appropriate health related issues, including available/recomended screening tests and vaccinations. We discussed a need for adhering to healthy diet and exercise. Labs were ordered to be later reviewed . All questions were answered.   

## 2021-09-18 NOTE — Assessment & Plan Note (Signed)
Options discussed - will repeat renal US ?

## 2021-09-18 NOTE — Assessment & Plan Note (Signed)
S/p lap chole 03/2021 - Dr Mitzi Davenport ?

## 2021-09-18 NOTE — Patient Instructions (Signed)
Sign up for [employer] library ( via Libby app on your phone or your ipad). If you don't have a library card  - go to any library branch. They will set you up in 15 minutes. It is free. You can check out books to read and to listen, check out magazines and newspapers, movies etc.   The Obesity Code book by Jason Fung   These suggestions will probably help you to improve your metabolism if you are not overweight and to lose weight if you are overweight: 1.  Reduce your consumption of sugars and starches.  Eliminate high fructose corn syrup from your diet.  Reduce your consumption of processed foods.  For desserts try to have seasonal fruits, berries, nuts, cheeses or dark chocolate with more than 70% cacao. 2.  Do not snack 3.  You do not have to eat breakfast.  If you choose to have breakfast - eat plain greek yogurt, eggs, oatmeal (without sugar) - use honey if you need to. 4.  Drink water, freshly brewed unsweetened tea (green, black or herbal) or coffee.  Do not drink sodas including diet sodas , juices, beverages sweetened with artificial sweeteners. 5.  Reduce your consumption of refined grains. 6.  Avoid protein drinks such as Optifast, Slim fast etc. Eat chicken, fish, meat, dairy and beans for your sources of protein. 7.  Natural unprocessed fats like cold pressed virgin olive oil, butter, coconut oil are good for you.  Eat avocados. 8.  Increase your consumption of fiber.  Fruits, berries, vegetables, whole grains, flaxseed, chia seeds, beans, popcorn, nuts, oatmeal are good sources of fiber 9.  Use vinegar in your diet, i.e. apple cider vinegar, red wine or balsamic vinegar 10.  You can try fasting.  For example you can skip breakfast and lunch every other day (24-hour fast) 11.  Stress reduction, good night sleep, relaxation, meditation, yoga and other physical activity is likely to help you to maintain low weight too. 12.  If you drink alcohol, limit your alcohol intake to no more than  2 drinks a day.  

## 2021-10-26 ENCOUNTER — Other Ambulatory Visit (HOSPITAL_COMMUNITY)
Admission: RE | Admit: 2021-10-26 | Discharge: 2021-10-26 | Disposition: A | Discharge status: Home / Self Care | Payer: BC Managed Care – PPO | Source: Ambulatory Visit | Attending: Obstetrics & Gynecology | Admitting: Obstetrics & Gynecology

## 2021-10-26 ENCOUNTER — Ambulatory Visit (INDEPENDENT_AMBULATORY_CARE_PROVIDER_SITE_OTHER): Payer: BC Managed Care – PPO | Admitting: Obstetrics & Gynecology

## 2021-10-26 ENCOUNTER — Encounter: Payer: Self-pay | Admitting: Obstetrics & Gynecology

## 2021-10-26 VITALS — BP 110/70 | HR 70 | Resp 16 | Ht 61.0 in | Wt 144.0 lb

## 2021-10-26 DIAGNOSIS — N840 Polyp of corpus uteri: Secondary | ICD-10-CM

## 2021-10-26 DIAGNOSIS — N951 Menopausal and female climacteric states: Secondary | ICD-10-CM | POA: Diagnosis not present

## 2021-10-26 DIAGNOSIS — Z01419 Encounter for gynecological examination (general) (routine) without abnormal findings: Secondary | ICD-10-CM

## 2021-10-26 NOTE — Progress Notes (Signed)
? ? ?Sue Watkins March 23, 1971 [id number] ? ? ?History:    51 y.o. G2P1A1L1 Married.  Professor of Nurse, children's at [employer].  Daughter (who I delivered :)) is 10+ yo at [place]. ? ?RP:  New patient presenting for annual gyn exam  ? ?HPI: Menses with normal flow every 1-2 months.  No BTB.  No pelvic pain.  No pain with IC.  Declines contraception, ok if conceives.  Pap Neg a few years ago.  Breasts normal.  Last Mammo 2 yrs ago, will schedule now.  BMI 27.21.  Health labs with Fam MD.  Schedule [place] now recommended. ? ? ?Past medical history,surgical history, family history and social history were all reviewed and documented in the EPIC chart. ? ?Gynecologic History ?Patient's last menstrual period was 10/05/2021 (exact date). ? ?Obstetric History ?OB History  ?Gravida Para Term Preterm AB Living  ?2 1 1   1 1   ?SAB IAB Ectopic Multiple Live Births  ?1       1  ?  ?# Outcome Date GA Lbr Len/2nd Weight Sex Delivery Anes PTL Lv  ?2 SAB           ?1 Term           ? ? ? ?ROS: A ROS was performed and pertinent positives and negatives are included in the history. ?GENERAL: No fevers or chills. HEENT: No change in vision, no earache, sore throat or sinus congestion. NECK: No pain or stiffness. CARDIOVASCULAR: No chest pain or pressure. No palpitations. PULMONARY: No shortness of breath, cough or wheeze. GASTROINTESTINAL: No abdominal pain, nausea, vomiting or diarrhea, melena or bright red blood per rectum. GENITOURINARY: No urinary frequency, urgency, hesitancy or dysuria. MUSCULOSKELETAL: No joint or muscle pain, no back pain, no recent trauma. DERMATOLOGIC: No rash, no itching, no lesions. ENDOCRINE: No polyuria, polydipsia, no heat or cold intolerance. No recent change in weight. HEMATOLOGICAL: No anemia or easy bruising or bleeding. NEUROLOGIC: No headache, seizures, numbness, tingling or weakness. PSYCHIATRIC: No depression, no loss of interest in normal activity or change in sleep pattern.  ?   ? ?Exam: ? ? ?BP 110/70   Pulse 70   Resp 16   Ht 5\' 1"  (1.549 m)   Wt 144 lb (65.3 kg)   LMP 10/05/2021 (Exact Date)   BMI 27.21 kg/m?  ? ?Body mass index is 27.21 kg/m?. ? ?General appearance : Well developed well nourished female. No acute distress ?HEENT: Eyes: no retinal hemorrhage or exudates,  Neck supple, trachea midline, no carotid bruits, no thyroidmegaly ?Lungs: Clear to auscultation, no rhonchi or wheezes, or rib retractions  ?Heart: Regular rate and rhythm, no murmurs or gallops ?Breast:Examined in sitting and supine position were symmetrical in appearance, no palpable masses or tenderness,  no skin retraction, no nipple inversion, no nipple discharge, no skin discoloration, no axillary or supraclavicular lymphadenopathy ?Abdomen: no palpable masses or tenderness, no rebound or guarding ?Extremities: no edema or skin discoloration or tenderness ? ?Pelvic: Vulva: Normal ?            Vagina: No gross lesions or discharge ? Cervix: No gross lesions or discharge.  Pap reflex done. ? Uterus  AV, normal size, shape and consistency, non-tender and mobile ? Adnexa  Without masses or tenderness ? Anus: Normal ? ? ?Assessment/Plan:  51 y.o. female for annual exam  ? ?1. Encounter for routine gynecological examination with Papanicolaou smear of cervix ?Menses with normal flow every 1-2 months.  No BTB.  No pelvic pain.  No pain with IC.  Declines contraception, ok if conceives.  Pap Neg a few years ago.  Breasts normal.  Last Mammo 2 yrs ago, will schedule now.  BMI 27.21.  Health labs with Fam MD.  Schedule [place] now recommended. ?- Cytology - PAP( [place]) ? ?2. Perimenopause ?Menses normal flow every 1-2 months.  Will observe.  Bleeding precautions reviewed. ? ?3. Uterine polyp ?H/O Uterine polyp, not removed.  F/U Pelvic US to reassess the Endometrial line. ?- US Transvaginal Non-OB; Future  ? ?Genia Del MD, 3:37 PM 10/26/2021 ? ?  ?

## 2021-11-01 LAB — CYTOLOGY - PAP
Comment: NEGATIVE
Diagnosis: UNDETERMINED — AB
High risk HPV: NEGATIVE

## 2021-11-09 ENCOUNTER — Ambulatory Visit
Admission: RE | Admit: 2021-11-09 | Discharge: 2021-11-09 | Disposition: A | Discharge status: Home / Self Care | Payer: BC Managed Care – PPO | Source: Ambulatory Visit | Attending: Internal Medicine | Admitting: Internal Medicine

## 2021-11-09 DIAGNOSIS — K760 Fatty (change of) liver, not elsewhere classified: Secondary | ICD-10-CM | POA: Diagnosis not present

## 2021-11-09 DIAGNOSIS — N2889 Other specified disorders of kidney and ureter: Secondary | ICD-10-CM | POA: Diagnosis not present

## 2021-11-09 DIAGNOSIS — N281 Cyst of kidney, acquired: Secondary | ICD-10-CM

## 2021-11-10 ENCOUNTER — Other Ambulatory Visit (INDEPENDENT_AMBULATORY_CARE_PROVIDER_SITE_OTHER): Payer: BC Managed Care – PPO

## 2021-11-10 DIAGNOSIS — Z0000 Encounter for general adult medical examination without abnormal findings: Secondary | ICD-10-CM | POA: Diagnosis not present

## 2021-11-10 LAB — CBC WITH DIFFERENTIAL/PLATELET
Basophils Absolute: 0 10*3/uL (ref 0.0–0.1)
Basophils Relative: 0.6 % (ref 0.0–3.0)
Eosinophils Absolute: 0.2 10*3/uL (ref 0.0–0.7)
Eosinophils Relative: 2.3 % (ref 0.0–5.0)
HCT: 41.9 % (ref 36.0–46.0)
Hemoglobin: 14.1 g/dL (ref 12.0–15.0)
Lymphocytes Relative: 35.7 % (ref 12.0–46.0)
Lymphs Abs: 2.4 10*3/uL (ref 0.7–4.0)
MCHC: 33.6 g/dL (ref 30.0–36.0)
MCV: 90.9 fl (ref 78.0–100.0)
Monocytes Absolute: 0.5 10*3/uL (ref 0.1–1.0)
Monocytes Relative: 7.7 % (ref 3.0–12.0)
Neutro Abs: 3.7 10*3/uL (ref 1.4–7.7)
Neutrophils Relative %: 53.7 % (ref 43.0–77.0)
Platelets: 206 10*3/uL (ref 150.0–400.0)
RBC: 4.6 Mil/uL (ref 3.87–5.11)
RDW: 13.1 % (ref 11.5–15.5)
WBC: 6.8 10*3/uL (ref 4.0–10.5)

## 2021-11-10 LAB — URINALYSIS
Bilirubin Urine: NEGATIVE
Hgb urine dipstick: NEGATIVE
Ketones, ur: NEGATIVE
Leukocytes,Ua: NEGATIVE
Nitrite: NEGATIVE
Specific Gravity, Urine: 1.025 (ref 1.000–1.030)
Total Protein, Urine: NEGATIVE
Urine Glucose: NEGATIVE
Urobilinogen, UA: 0.2 (ref 0.0–1.0)
pH: 5.5 (ref 5.0–8.0)

## 2021-11-10 LAB — COMPREHENSIVE METABOLIC PANEL
ALT: 21 U/L (ref 0–35)
AST: 17 U/L (ref 0–37)
Albumin: 4.3 g/dL (ref 3.5–5.2)
Alkaline Phosphatase: 60 U/L (ref 39–117)
BUN: 18 mg/dL (ref 6–23)
CO2: 28 mEq/L (ref 19–32)
Calcium: 9.4 mg/dL (ref 8.4–10.5)
Chloride: 105 mEq/L (ref 96–112)
Creatinine, Ser: 0.79 mg/dL (ref 0.40–1.20)
GFR: 88.14 mL/min (ref >60.00)
Glucose, Bld: 94 mg/dL (ref 70–99)
Potassium: 4 mEq/L (ref 3.5–5.1)
Sodium: 140 mEq/L (ref 135–145)
Total Bilirubin: 0.4 mg/dL (ref 0.2–1.2)
Total Protein: 7.6 g/dL (ref 6.0–8.3)

## 2021-11-10 LAB — LIPID PANEL
Cholesterol: 154 mg/dL (ref 0–200)
HDL: 44.1 mg/dL (ref >39.00)
LDL Cholesterol: 94 mg/dL (ref 0–99)
NonHDL: 110.03
Total CHOL/HDL Ratio: 3
Triglycerides: 80 mg/dL (ref 0.0–149.0)
VLDL: 16 mg/dL (ref 0.0–40.0)

## 2021-11-10 LAB — TSH: TSH: 1.13 u[IU]/mL (ref 0.35–5.50)

## 2021-12-07 ENCOUNTER — Ambulatory Visit (INDEPENDENT_AMBULATORY_CARE_PROVIDER_SITE_OTHER): Payer: BC Managed Care – PPO

## 2021-12-07 ENCOUNTER — Encounter: Payer: Self-pay | Admitting: Obstetrics & Gynecology

## 2021-12-07 ENCOUNTER — Telehealth: Payer: Self-pay | Admitting: *Deleted

## 2021-12-07 ENCOUNTER — Ambulatory Visit: Payer: BC Managed Care – PPO | Admitting: Obstetrics & Gynecology

## 2021-12-07 VITALS — BP 124/86

## 2021-12-07 DIAGNOSIS — N840 Polyp of corpus uteri: Secondary | ICD-10-CM | POA: Diagnosis not present

## 2021-12-07 NOTE — Telephone Encounter (Signed)
-----   Message from Genia Del, MD sent at 12/07/2021 12:39 PM EDT ----- Regarding: Schedule Surgery Surgery: CPT 58558 - Hysteroscopy/D&C/Myosure  Diagnosis: N84.0 Endometrial Polyp  Location: [facility]  Status: Outpatient  Time: 30 Minutes  Assistant: N/A  Urgency: First Available  Pre-Op Appointment: To Be Scheduled  Post-Op Appointment(s): 2 Weeks  Time Out Of Work: Day Of Surgery ONLY

## 2021-12-07 NOTE — Progress Notes (Incomplete)
    Sue Watkins 01/06/1971 [id number]        51 y.o.  G2P1A1L1   RP: Probable Endometrial Polyp for Pelvic US  HPI: ***   OB History  Gravida Para Term Preterm AB Living  2 1 1   1 1   SAB IAB Ectopic Multiple Live Births  1       1    # Outcome Date GA Lbr Len/2nd Weight Sex Delivery Anes PTL Lv  2 SAB           1 Term             Past medical history,surgical history, problem list, medications, allergies, family history and social history were all reviewed and documented in the EPIC chart.   Directed ROS with pertinent positives and negatives documented in the history of present illness/assessment and plan.  Exam:  Vitals:   12/07/21 1215  BP: 124/86   General appearance:  Normal  Pelvic 12/09/21 today:   Assessment/Plan:  51 y.o. G2P1011   1. Endometrial polyp  Pelvic 52 findings thoroughly reviewed.  Will proceed with HSC/Myosure Excision/D+C.  [place] MD, 12:36 PM 12/07/2021

## 2021-12-08 NOTE — Telephone Encounter (Signed)
Spoke with patient.  Reviewed surgery dates.  Patient is traveling 7/11 -8/10.  Next available date after patient returns is 8/16. Patient is requesting earlier surgery date if possible. Advised I will review open time with OR scheduler and return call if earlier date is an option. Will f/u with update. Patient agreeable.   iQueue request submitted for 6/27.

## 2021-12-14 ENCOUNTER — Encounter (HOSPITAL_BASED_OUTPATIENT_CLINIC_OR_DEPARTMENT_OTHER): Payer: Self-pay | Admitting: Obstetrics & Gynecology

## 2021-12-14 ENCOUNTER — Other Ambulatory Visit: Payer: Self-pay

## 2021-12-14 ENCOUNTER — Encounter: Payer: Self-pay | Admitting: Obstetrics & Gynecology

## 2021-12-14 NOTE — Progress Notes (Signed)
Spoke w/ via phone for pre-op interview---pt Lab needs dos----poct urine preg per anesthesia, surgery  orders pending               Lab results------none COVID test -----patient states asymptomatic no test needed Arrive at -------900 am 12-18-2021 NPO after MN NO Solid Food.  Clear liquids from MN until---800 am  Med rec completed Medications to take morning of surgery -----none Diabetic medication -----n/a Patient instructed no nail polish to be worn day of surgery Patient instructed to bring photo id and insurance card day of surgery Patient aware to have Driver (ride ) / caregiver husband Sue Watkins    for 24 hours after surgery  Patient Special Instructions -----none Pre-Op special Istructions -----surgery orders requested dr Seymour Bars epic ib Patient verbalized understanding of instructions that were given at this phone interview. Patient denies shortness of breath, chest pain, fever, cough at this phone interview.

## 2021-12-14 NOTE — Telephone Encounter (Signed)
Call to patient to review pre-op instructions. Patient states now is not a good time to discuss, she will return call in the morning. Confirmed patient does not take any OTC aspirin, Vit E or herbal supplements. Patient verbalizes understanding.   Reviewed with Dr. Seymour Bars, patient does not need additional pre-op unless patient request. MyChart visit OK if patient request.

## 2021-12-14 NOTE — Telephone Encounter (Signed)
Reviewed with Dr. Seymour Bars.   Spoke with patient. Reviewed surgery dates. Patient request to proceed with surgery on 12/18/21.  Advised patient I will forward to business office for return call. I will return call once surgery date and time confirmed. Patient verbalizes understanding and is agreeable.   iQueue request submitted.  Surgery request sent.

## 2021-12-18 ENCOUNTER — Encounter (HOSPITAL_BASED_OUTPATIENT_CLINIC_OR_DEPARTMENT_OTHER): Payer: Self-pay | Admitting: Obstetrics & Gynecology

## 2021-12-18 ENCOUNTER — Ambulatory Visit (HOSPITAL_BASED_OUTPATIENT_CLINIC_OR_DEPARTMENT_OTHER): Payer: BC Managed Care – PPO | Admitting: Certified Registered"

## 2021-12-18 ENCOUNTER — Ambulatory Visit (HOSPITAL_BASED_OUTPATIENT_CLINIC_OR_DEPARTMENT_OTHER)
Admission: RE | Admit: 2021-12-18 | Discharge: 2021-12-18 | Disposition: A | Discharge status: Home / Self Care | Payer: BC Managed Care – PPO | Attending: Obstetrics & Gynecology | Admitting: Obstetrics & Gynecology

## 2021-12-18 ENCOUNTER — Other Ambulatory Visit: Payer: Self-pay

## 2021-12-18 ENCOUNTER — Encounter (HOSPITAL_BASED_OUTPATIENT_CLINIC_OR_DEPARTMENT_OTHER)
Admission: RE | Disposition: A | Discharge status: Home / Self Care | Payer: Self-pay | Source: Home / Self Care | Attending: Obstetrics & Gynecology

## 2021-12-18 DIAGNOSIS — Z8711 Personal history of peptic ulcer disease: Secondary | ICD-10-CM | POA: Diagnosis not present

## 2021-12-18 DIAGNOSIS — Z01818 Encounter for other preprocedural examination: Secondary | ICD-10-CM

## 2021-12-18 DIAGNOSIS — D649 Anemia, unspecified: Secondary | ICD-10-CM | POA: Diagnosis not present

## 2021-12-18 DIAGNOSIS — N840 Polyp of corpus uteri: Secondary | ICD-10-CM | POA: Insufficient documentation

## 2021-12-18 DIAGNOSIS — D759 Disease of blood and blood-forming organs, unspecified: Secondary | ICD-10-CM | POA: Insufficient documentation

## 2021-12-18 HISTORY — PX: DILATATION & CURETTAGE/HYSTEROSCOPY WITH MYOSURE: SHX6511

## 2021-12-18 HISTORY — DX: Nausea with vomiting, unspecified: Z98.890

## 2021-12-18 HISTORY — DX: Presence of spectacles and contact lenses: Z97.3

## 2021-12-18 HISTORY — DX: Cyst of kidney, acquired: N28.1

## 2021-12-18 LAB — CBC
HCT: 41.9 % (ref 36.0–46.0)
Hemoglobin: 14.2 g/dL (ref 12.0–15.0)
MCH: 30.9 pg (ref 26.0–34.0)
MCHC: 33.9 g/dL (ref 30.0–36.0)
MCV: 91.3 fL (ref 80.0–100.0)
Platelets: 228 10*3/uL (ref 150–400)
RBC: 4.59 MIL/uL (ref 3.87–5.11)
RDW: 12.8 % (ref 11.5–15.5)
WBC: 6.8 10*3/uL (ref 4.0–10.5)
nRBC: 0 % (ref 0.0–0.2)

## 2021-12-18 LAB — POCT PREGNANCY, URINE: Preg Test, Ur: NEGATIVE

## 2021-12-18 SURGERY — DILATATION & CURETTAGE/HYSTEROSCOPY WITH MYOSURE
Anesthesia: General | Site: Vagina

## 2021-12-18 MED ORDER — MIDAZOLAM HCL 2 MG/2ML IJ SOLN
INTRAMUSCULAR | Status: AC
  Filled 2021-12-18: qty 2

## 2021-12-18 MED ORDER — AMISULPRIDE (ANTIEMETIC) 5 MG/2ML IV SOLN: 10.0000 mg | Freq: Once | INTRAVENOUS | Status: DC | PRN

## 2021-12-18 MED ORDER — LIDOCAINE HCL (PF) 2 % IJ SOLN
INTRAMUSCULAR | Status: AC
  Filled 2021-12-18: qty 15

## 2021-12-18 MED ORDER — FENTANYL CITRATE (PF) 250 MCG/5ML IJ SOLN
INTRAMUSCULAR | Status: DC | PRN
  Administered 2021-12-18: 25 ug via INTRAVENOUS

## 2021-12-18 MED ORDER — LACTATED RINGERS IV SOLN: INTRAVENOUS | Status: DC

## 2021-12-18 MED ORDER — KETOROLAC TROMETHAMINE 30 MG/ML IJ SOLN
INTRAMUSCULAR | Status: DC | PRN
  Administered 2021-12-18: 30 mg via INTRAVENOUS

## 2021-12-18 MED ORDER — ACETAMINOPHEN 500 MG PO TABS
1000.0000 mg | ORAL_TABLET | Freq: Once | ORAL | Status: AC
Start: 2021-12-18 — End: 2021-12-18
  Administered 2021-12-18: 1000 mg via ORAL

## 2021-12-18 MED ORDER — FENTANYL CITRATE (PF) 100 MCG/2ML IJ SOLN: 25.0000 ug | INTRAMUSCULAR | Status: DC | PRN

## 2021-12-18 MED ORDER — LIDOCAINE HCL (CARDIAC) PF 100 MG/5ML IV SOSY: PREFILLED_SYRINGE | INTRAVENOUS | Status: DC | PRN

## 2021-12-18 MED ORDER — POVIDONE-IODINE 10 % EX SWAB: 2.0000 | Freq: Once | CUTANEOUS | Status: DC

## 2021-12-18 MED ORDER — OXYCODONE HCL 5 MG PO TABS: 5.0000 mg | ORAL_TABLET | Freq: Once | ORAL | Status: DC | PRN

## 2021-12-18 MED ORDER — LACTATED RINGERS IV SOLN
INTRAVENOUS | Status: DC
Start: 2021-12-18 — End: 2021-12-18

## 2021-12-18 MED ORDER — LIDOCAINE HCL 1 % IJ SOLN
INTRAMUSCULAR | Status: DC | PRN
  Administered 2021-12-18: 10 mL

## 2021-12-18 MED ORDER — DEXAMETHASONE SODIUM PHOSPHATE 10 MG/ML IJ SOLN
INTRAMUSCULAR | Status: DC | PRN
  Administered 2021-12-18: 5 mg via INTRAVENOUS

## 2021-12-18 MED ORDER — PROPOFOL 10 MG/ML IV BOLUS
INTRAVENOUS | Status: DC | PRN
  Administered 2021-12-18: 140 mg via INTRAVENOUS

## 2021-12-18 MED ORDER — MEPERIDINE HCL 25 MG/ML IJ SOLN: 6.2500 mg | INTRAMUSCULAR | Status: DC | PRN

## 2021-12-18 MED ORDER — DEXAMETHASONE SODIUM PHOSPHATE 10 MG/ML IJ SOLN
INTRAMUSCULAR | Status: AC
  Filled 2021-12-18: qty 2

## 2021-12-18 MED ORDER — MIDAZOLAM HCL 2 MG/2ML IJ SOLN
INTRAMUSCULAR | Status: DC | PRN
  Administered 2021-12-18: 2 mg via INTRAVENOUS

## 2021-12-18 MED ORDER — ONDANSETRON HCL 4 MG/2ML IJ SOLN
INTRAMUSCULAR | Status: DC | PRN
  Administered 2021-12-18: 4 mg via INTRAVENOUS

## 2021-12-18 MED ORDER — SCOPOLAMINE 1 MG/3DAYS TD PT72
1.0000 | MEDICATED_PATCH | TRANSDERMAL | Status: DC
Start: 2021-12-18 — End: 2021-12-18

## 2021-12-18 MED ORDER — LIDOCAINE 2% (20 MG/ML) 5 ML SYRINGE
INTRAMUSCULAR | Status: DC | PRN
  Administered 2021-12-18: 50 mg via INTRAVENOUS

## 2021-12-18 MED ORDER — CEFAZOLIN SODIUM-DEXTROSE 2-4 GM/100ML-% IV SOLN
INTRAVENOUS | Status: AC
  Filled 2021-12-18: qty 100

## 2021-12-18 MED ORDER — ACETAMINOPHEN 500 MG PO TABS
ORAL_TABLET | ORAL | Status: AC
  Filled 2021-12-18: qty 2

## 2021-12-18 MED ORDER — CEFAZOLIN SODIUM-DEXTROSE 2-4 GM/100ML-% IV SOLN
2.0000 g | INTRAVENOUS | Status: AC
  Administered 2021-12-18: 2 g via INTRAVENOUS

## 2021-12-18 MED ORDER — KETOROLAC TROMETHAMINE 30 MG/ML IJ SOLN
INTRAMUSCULAR | Status: AC
  Filled 2021-12-18: qty 5

## 2021-12-18 MED ORDER — SODIUM CHLORIDE 0.9 % IR SOLN
Status: DC | PRN
  Administered 2021-12-18: 1500 mL

## 2021-12-18 MED ORDER — PROPOFOL 10 MG/ML IV BOLUS
INTRAVENOUS | Status: AC
  Filled 2021-12-18: qty 20

## 2021-12-18 MED ORDER — ONDANSETRON HCL 4 MG/2ML IJ SOLN
INTRAMUSCULAR | Status: AC
  Filled 2021-12-18: qty 6

## 2021-12-18 MED ORDER — OXYCODONE HCL 5 MG/5ML PO SOLN: 5.0000 mg | Freq: Once | ORAL | Status: DC | PRN

## 2021-12-18 MED ORDER — FENTANYL CITRATE (PF) 250 MCG/5ML IJ SOLN
INTRAMUSCULAR | Status: AC
  Filled 2021-12-18: qty 5

## 2021-12-18 SURGICAL SUPPLY — 24 items
CATH ROBINSON RED A/P 16FR (CATHETERS) ×1 IMPLANT
DEVICE MYOSURE LITE (MISCELLANEOUS) IMPLANT
DEVICE MYOSURE REACH (MISCELLANEOUS) ×1 IMPLANT
DILATOR CANAL MILEX (MISCELLANEOUS) IMPLANT
DRSG TELFA 3X8 NADH (GAUZE/BANDAGES/DRESSINGS) IMPLANT
ELECT REM PT RETURN 9FT ADLT (ELECTROSURGICAL)
ELECTRODE REM PT RTRN 9FT ADLT (ELECTROSURGICAL) IMPLANT
GAUZE 4X4 16PLY ~~LOC~~+RFID DBL (SPONGE) ×3 IMPLANT
GLOVE BIO SURGEON STRL SZ 6.5 (GLOVE) ×2 IMPLANT
GLOVE BIOGEL PI IND STRL 7.0 (GLOVE) ×2 IMPLANT
GLOVE BIOGEL PI INDICATOR 7.0 (GLOVE) ×2
GOWN STRL REUS W/TWL LRG LVL3 (GOWN DISPOSABLE) ×4 IMPLANT
IV NS IRRIG 3000ML ARTHROMATIC (IV SOLUTION) ×2 IMPLANT
KIT PROCEDURE FLUENT (KITS) ×2 IMPLANT
KIT TURNOVER CYSTO (KITS) ×2 IMPLANT
MYOSURE XL FIBROID (MISCELLANEOUS)
PACK VAGINAL MINOR WOMEN LF (CUSTOM PROCEDURE TRAY) ×2 IMPLANT
PAD DRESSING TELFA 3X8 NADH (GAUZE/BANDAGES/DRESSINGS) ×1 IMPLANT
PAD OB MATERNITY 4.3X12.25 (PERSONAL CARE ITEMS) ×2 IMPLANT
PAD PREP 24X48 CUFFED NSTRL (MISCELLANEOUS) ×2 IMPLANT
SEAL CERVICAL OMNI LOK (ABLATOR) IMPLANT
SEAL ROD LENS SCOPE MYOSURE (ABLATOR) ×2 IMPLANT
SYSTEM TISS REMOVAL MYOSURE XL (MISCELLANEOUS) IMPLANT
TOWEL OR 17X26 10 PK STRL BLUE (TOWEL DISPOSABLE) ×2 IMPLANT

## 2021-12-18 NOTE — Op Note (Signed)
Operative Note  12/18/2021  12:17 PM  PATIENT:  Sue Watkins  51 y.o. female  PRE-OPERATIVE DIAGNOSIS:  Endometrial polyp  POST-OPERATIVE DIAGNOSIS:  Endometrial polyps  PROCEDURE:  Procedure(s): DILATATION & CURETTAGE/HYSTEROSCOPY WITH MYOSURE EXCISIONS  SURGEON:  Surgeon(s): Genia Del, MD  ANESTHESIA:   general with laryngeal mask  FINDINGS: Many endometrial polyps.  Intrauterine cavity otherwise normal with both ostia well seen.  DESCRIPTION OF OPERATION: Under general anesthesia with laryngeal mask, the patient is in lithotomy position.  She is prepped with Betadine on the suprapubic, vulvar and vaginal areas.  The bladder was catheterized.  The patient is draped as usual.  Timeout was done.  The vaginal exam reveals an anteverted uterus, normal volume, mobile.  No adnexal mass.  The speculum is inserted in the vagina and the anterior lip of the cervix is grasped with a tenaculum.  A paracervical block is done at 4 and 8:00 with lidocaine 1% a total of 10 cc.  Dilation of the cervix with Pratt dilators up to #21 without difficulty.  The hysteroscope is inserted in the intra uterine cavity.  Many small endometrial polyps are present.  Both ostia are well visualized.  No other lesion is present.  Pictures are taken.  The MyoSure reach is inserted through the hysteroscope.  All of the endometrial polyps are excised.  Hemostasis is adequate.  Pictures are taken.  The hysteroscope with MyoSure are removed from the intra uterine cavity.  A systematic curettage of the intra uterine cavity on all surfaces is done with a sharp curette.  All specimens are sent together to pathology.  The curette is removed.  The tenaculum is removed from the cervix.  Hemostasis is adequate.  The speculum is removed.  The patient is brought to recovery room in good and stable status.  ESTIMATED BLOOD LOSS: 5 mL Fluid deficit: 45 mL  Intake/Output Summary (Last 24 hours) at 12/18/2021 1217 Last data  filed at 12/18/2021 1200 Gross per 24 hour  Intake 500 ml  Output 5 ml  Net 495 ml     BLOOD ADMINISTERED:none   LOCAL MEDICATIONS USED:  LIDOCAINE 1% 10 cc for Paracervical block  SPECIMEN:  Source of Specimen:  Endometrial polyps and endometrial curettings  DISPOSITION OF SPECIMEN:  PATHOLOGY  COUNTS:  YES  PLAN OF CARE: Transfer to PACU  Marie-Lyne LavoieMD12:17 PM

## 2021-12-18 NOTE — H&P (Addendum)
JAIEL HOWER is an 51 y.o. female.L2G4W1U2    RP: Endometrial Polyps for HSC/Myosure Excisions/D+C   HPI: No change x last visit.  Menses with normal flow every 1-2 months.  No BTB.  No pelvic pain.  No pain with IC.  Declines contraception, ok if conceives.  H/O Uterine polyp, not removed.    Pertinent Gynecological History: Menses: flow is moderate Contraception: none Blood transfusions: none Sexually transmitted diseases: no past history Last mammogram: normal  Last pap: normal  OB History: G2P1A1L1   Menstrual History: No LMP recorded (lmp unknown).    Past Medical History:  Diagnosis Date   Benign cyst of left kidney    per 11-09-2021 [place] in epic   H pylori ulcer    Hepatitis    as a child issue resolved   PONV (postoperative nausea and vomiting)    with cholecysectomy   Wears glasses or contact lenses     Past Surgical History:  Procedure Laterality Date   CHOLECYSTECTOMY N/A 04/14/2021   Procedure: LAPAROSCOPIC CHOLECYSTECTOMY;  Surgeon: Fritzi Mandes, MD;  Location: [facility] OR;  Service: General;  Laterality: N/A;   egg retrieval surgery     5 to 7 yrs ago per pt on 12-14-2021   uterine polyp removed     5  to 7 yrs ago per pt on 12-14-2021    Family History  Problem Relation Age of Onset   Hypertension Mother    Thyroid cancer Mother        69s   Heart attack Mother 26       MI   Cancer Father 35       lung cancer   Hypertension Sister    Diabetes Sister    Breast cancer Sister    Uterine cancer Sister    Stomach cancer Maternal Grandmother    Diabetes Paternal Grandmother     Social History:  reports that she has never smoked. She has never used smokeless tobacco. She reports current alcohol use. She reports that she does not use drugs.  Allergies: No Known Allergies  No medications prior to admission.    REVIEW OF SYSTEMS: A ROS was performed and pertinent positives and negatives are included in the history. GENERAL: No fevers or chills.  HEENT: No change in vision, no earache, sore throat or sinus congestion. NECK: No pain or stiffness. CARDIOVASCULAR: No chest pain or pressure. No palpitations. PULMONARY: No shortness of breath, cough or wheeze. GASTROINTESTINAL: No abdominal pain, nausea, vomiting or diarrhea, melena or bright red blood per rectum. GENITOURINARY: No urinary frequency, urgency, hesitancy or dysuria. MUSCULOSKELETAL: No joint or muscle pain, no back pain, no recent trauma. DERMATOLOGIC: No rash, no itching, no lesions. ENDOCRINE: No polyuria, polydipsia, no heat or cold intolerance. No recent change in weight. HEMATOLOGICAL: No anemia or easy bruising or bleeding. NEUROLOGIC: No headache, seizures, numbness, tingling or weakness. PSYCHIATRIC: No depression, no loss of interest in normal activity or change in sleep pattern.    Blood pressure (!) 141/73, pulse 71, temperature (!) 97 F (36.1 C), temperature source Oral, resp. rate 15, height 5\' 1"  (1.549 m), weight 67.2 kg, SpO2 98 %.  Physical Exam:  Results for orders placed or performed during the hospital encounter of 12/18/21 (from the past 24 hour(s))  Pregnancy, urine POC     Status: None   Collection Time: 12/18/21  9:27 AM  Result Value Ref Range   Preg Test, Ur NEGATIVE NEGATIVE   Pelvic US 12/07/21:  T/V  images.  Retroverted uterus normal in size and shape with no myometrial mass.  The overall uterine size is measured at 7.12 x 5.06 x 4.06 cm.  As symmetrical thickened endometrium measured at 11 mm.  3D imaging suggests 2 endometrial masses at 1.3 cm and 1.1 cm.  Feeder vessels to these masses are identified.  Right ovary is small with atrophic appearance.  Left ovary with a 2.5 x 2.1 cm simple follicle.  No adnexal mass.  No free fluid in the pelvis.     Assessment/Plan:  51 y.o. G2P1011    1. Endometrial polyp  Menses with normal flow every 1-2 months.  No BTB.  No pelvic pain. No pain with IC.  Declines contraception, ok if conceives.  H/O Uterine  polyp, not removed. Pelvic US findings thoroughly reviewed.  Probable Endometrial Polyps x 2.  Will proceed with HSC/Myosure Excision/D+C.  Surgery and risks reviewed thoroughly, including the risks of uterine perforation.  Preop preparation and postop expectations and precautions reviewed.  HSC pamphlet given.                         Patient was counseled as to the risk of surgery to include the following:   1. Infection (prohylactic antibiotics will be administered)   2. DVT/Pulmonary Embolism (prophylactic pneumo compression stockings will be used)   3.Trauma to internal organs requiring additional surgical procedure to repair any injury to internal organs requiring perhaps additional hospitalization days.   4.Hemmorhage requiring transfusion and blood products which carry risks such as anaphylactic reaction, hepatitis and AIDS   Patient had received literature information on the procedure scheduled and all her questions were answered and fully accepts all risk.  Marie-Lyne Brooks Kinnan 12/18/2021, 10:13 AM

## 2021-12-19 ENCOUNTER — Encounter (HOSPITAL_BASED_OUTPATIENT_CLINIC_OR_DEPARTMENT_OTHER): Payer: Self-pay | Admitting: Obstetrics & Gynecology

## 2021-12-19 LAB — SURGICAL PATHOLOGY

## 2021-12-29 ENCOUNTER — Encounter: Payer: Self-pay | Admitting: Obstetrics & Gynecology

## 2021-12-29 ENCOUNTER — Ambulatory Visit (INDEPENDENT_AMBULATORY_CARE_PROVIDER_SITE_OTHER): Payer: BC Managed Care – PPO | Admitting: Obstetrics & Gynecology

## 2021-12-29 VITALS — BP 108/64 | HR 80

## 2021-12-29 DIAGNOSIS — N840 Polyp of corpus uteri: Secondary | ICD-10-CM

## 2021-12-29 DIAGNOSIS — Z09 Encounter for follow-up examination after completed treatment for conditions other than malignant neoplasm: Secondary | ICD-10-CM

## 2021-12-29 NOTE — Progress Notes (Signed)
    Sue Watkins 02-Aug-1970 [id number]        51 y.o.  G2P1011   RP: Post HSC/Myosure excisions/D+C on 12/18/21  HPI: Very good postop healing.  No abdominopelvic pain.  Minimal vaginal spotting.  No vaginal discharge.  No fever.   OB History  Gravida Para Term Preterm AB Living  2 1 1   1 1   SAB IAB Ectopic Multiple Live Births  1       1    # Outcome Date GA Lbr Len/2nd Weight Sex Delivery Anes PTL Lv  2 SAB           1 Term             Past medical history,surgical history, problem list, medications, allergies, family history and social history were all reviewed and documented in the EPIC chart.   Directed ROS with pertinent positives and negatives documented in the history of present illness/assessment and plan.  Exam:  Vitals:   12/29/21 1042  BP: 108/64  Pulse: 80  SpO2: 98%   General appearance:  Normal  Abdomen: Normal  Gynecologic exam: Vulva normal.  Bimanual exam:  Uterus AV, normal volume, mobile, NT.  No adnexal mass, NT.  Not bleeding.  Normal secretions.  Patho 12/18/2021: FINAL MICROSCOPIC DIAGNOSIS:   A. ENDOMETRIUM, POLYP, CURETTAGE:  Benign endometrial polyp  Benign proliferative phase endometrium  Abundant benign endocervical epithelium  Negative for breakdown, atypia, hyperplasia and carcinoma    Assessment/Plan:  51 y.o. G2P1011   1. Status post gynecological surgery, follow-up exam Very good postop healing.  No abdominopelvic pain.  Minimal vaginal spotting.  No vaginal discharge.  No fever. Normal gyn exam postop.  Patho benign, patient reassured.  2. Endometrial polyps  Benign endometrial polyps excised.  54 MD, 11:08 AM 12/29/2021

## 2022-04-26 ENCOUNTER — Ambulatory Visit (HOSPITAL_COMMUNITY)
Admission: RE | Admit: 2022-04-26 | Discharge: 2022-04-26 | Disposition: A | Discharge status: Home / Self Care | Payer: BC Managed Care – PPO | Source: Ambulatory Visit | Attending: Internal Medicine | Admitting: Internal Medicine

## 2022-04-26 ENCOUNTER — Encounter (HOSPITAL_COMMUNITY): Payer: Self-pay

## 2022-04-26 VITALS — BP 137/88 | HR 105 | Temp 100.1°F | Resp 20

## 2022-04-26 DIAGNOSIS — J988 Other specified respiratory disorders: Secondary | ICD-10-CM | POA: Diagnosis not present

## 2022-04-26 DIAGNOSIS — B9789 Other viral agents as the cause of diseases classified elsewhere: Secondary | ICD-10-CM | POA: Insufficient documentation

## 2022-04-26 DIAGNOSIS — Z79899 Other long term (current) drug therapy: Secondary | ICD-10-CM | POA: Insufficient documentation

## 2022-04-26 DIAGNOSIS — Z1152 Encounter for screening for COVID-19: Secondary | ICD-10-CM | POA: Insufficient documentation

## 2022-04-26 LAB — RESP PANEL BY RT-PCR (FLU A&B, COVID) ARPGX2
Influenza A by PCR: NEGATIVE
Influenza B by PCR: NEGATIVE
SARS Coronavirus 2 by RT PCR: NEGATIVE

## 2022-04-26 MED ORDER — BENZONATATE 100 MG PO CAPS: 100.0000 mg | ORAL_CAPSULE | Freq: Three times a day (TID) | ORAL | 0 refills | Status: DC | PRN

## 2022-04-26 MED ORDER — GUAIFENESIN ER 600 MG PO TB12: 600.0000 mg | ORAL_TABLET | Freq: Two times a day (BID) | ORAL | 0 refills | Status: AC

## 2022-04-26 NOTE — Discharge Instructions (Addendum)
Humidifier use will help with nasal congestion and chest congestion VapoRub use will help with chest congestion and cough Take medications as prescribed We will call you with recommendation if labs are abnormal Your lung exam is unremarkable Maintain adequate hydration Return to urgent care if symptoms persist or worsens.

## 2022-04-26 NOTE — ED Provider Notes (Signed)
[facility]    CSN: [id number] Arrival date & time: 04/26/22  1126      History   Chief Complaint Chief Complaint  Patient presents with   Cough   Fatigue    HPI Sue Watkins is a 51 y.o. female comes to urgent care with 4-day history of nonproductive cough, fever of 38.5 C, and chest congestion.  Patient says symptoms started insidiously and has been persistent.  No nausea or vomiting.  Patient had some generalized body aches during the febrile episodes.  Fever was associated with chills.  Patient denies any nausea, vomiting or diarrhea.  Patient's daughter recently recovered from pneumonia infection.  She is concerned that she may have pneumonia.  She complains of bilateral ear fullness with no ear discharge.  No dizziness, near syncope or syncopal episodes.  Patient is a Runner, broadcasting/film/video in [employer].  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  HPI  Past Medical History:  Diagnosis Date   Benign cyst of left kidney    per 11-09-2021 [place] in epic   H pylori ulcer    Hepatitis    as a child issue resolved   PONV (postoperative nausea and vomiting)    with cholecysectomy   Wears glasses or contact lenses     Patient Active Problem List   Diagnosis Date Noted   Epigastric pain 03/22/2021   Gallstone 02/09/2021   Renal cyst 02/09/2021   Abdominal pain 01/26/2021   Tinnitus of left ear 06/28/2020   Hematochezia 06/28/2020   Thyroid nodule 06/13/2018   Upper respiratory infection 05/30/2017   Left wrist pain 11/07/2016   Well adult exam 04/30/2016   Sinusitis, chronic 04/30/2016   Anemia 04/30/2016   Grief 04/30/2016   Uterine polyp 12/11/2013   GERD (gastroesophageal reflux disease) 04/23/2012   Allergic rhinitis 04/23/2012   Overweight (BMI 25.0-29.9) 04/23/2012    Past Surgical History:  Procedure Laterality Date   CHOLECYSTECTOMY N/A 04/14/2021   Procedure: LAPAROSCOPIC CHOLECYSTECTOMY;  Surgeon: Fritzi Mandes,  MD;  [facility]: [facility];  Service: General;  Laterality: N/A;   DILATATION & CURETTAGE/HYSTEROSCOPY WITH MYOSURE N/A 12/18/2021   Procedure: DILATATION & CURETTAGE/HYSTEROSCOPY WITH MYOSURE;  Surgeon: Genia Del, MD;  Location: [facility];  Service: Gynecology;  Laterality: N/A;   egg retrieval surgery     5 to 7 yrs ago per pt on 12-14-2021   uterine polyp removed     5  to 7 yrs ago per pt on 12-14-2021    OB History     Gravida  2   Para  1   Term  1   Preterm      AB  1   Living  1      SAB  1   IAB      Ectopic      Multiple      Live Births  1            Home Medications    Prior to Admission medications   Medication Sig Start Date End Date Taking? Authorizing Provider  benzonatate (TESSALON) 100 MG capsule Take 1 capsule (100 mg total) by mouth 3 (three) times daily as needed for cough. 04/26/22  Yes Zykeriah Mathia, Britta Mccreedy, MD  guaiFENesin (MUCINEX) 600 MG 12 hr tablet Take 1 tablet (600 mg total) by mouth 2 (two) times daily for 10 days. 04/26/22 05/06/22 Yes Blessings Inglett, Britta Mccreedy, MD    Family History Family History  Problem Relation Age of Onset   Hypertension Mother    Thyroid cancer Mother        70s   Heart attack Mother 23       MI   Cancer Father 55       lung cancer   Hypertension Sister    Diabetes Sister    Breast cancer Sister    Uterine cancer Sister    Stomach cancer Maternal Grandmother    Diabetes Paternal Grandmother     Social History Social History   Tobacco Use   Smoking status: Never   Smokeless tobacco: Never  Vaping Use   Vaping Use: Never used  Substance Use Topics   Alcohol use: Yes    Comment: occasional   Drug use: No     Allergies   Patient has no known allergies.   Review of Systems Review of Systems  Constitutional: Negative.   HENT:  Positive for congestion. Negative for postnasal drip, sinus pressure and sinus pain.   Respiratory:  Positive for cough and shortness of breath.  Negative for chest tightness and wheezing.   Cardiovascular:  Negative for chest pain.  Gastrointestinal: Negative.   Neurological: Negative.      Physical Exam Triage Vital Signs ED Triage Vitals  Enc Vitals Group     BP 04/26/22 1213 137/88     Pulse Rate 04/26/22 1213 (!) 105     Resp 04/26/22 1213 20     Temp 04/26/22 1213 100.1 F (37.8 C)     Temp Source 04/26/22 1213 Oral     SpO2 04/26/22 1213 95 %     Weight --      Height --      Head Circumference --      Peak Flow --      Pain Score 04/26/22 1212 0     Pain Loc --      Pain Edu? --      Excl. in [place]? --    No data found.  Updated Vital Signs BP 137/88 (BP Location: Right Arm)   Pulse (!) 105   Temp 100.1 F (37.8 C) (Oral)   Resp 20   LMP 03/21/2022 (Approximate)   SpO2 95%   Visual Acuity Right Eye Distance:   Left Eye Distance:   Bilateral Distance:    Right Eye Near:   Left Eye Near:    Bilateral Near:     Physical Exam Vitals and nursing note reviewed.  Constitutional:      General: She is not in acute distress.    Appearance: She is not ill-appearing.  HENT:     Right Ear: Tympanic membrane normal.     Left Ear: Tympanic membrane normal.  Cardiovascular:     Rate and Rhythm: Normal rate and regular rhythm.     Pulses: Normal pulses.     Heart sounds: Normal heart sounds.  Pulmonary:     Effort: Pulmonary effort is normal.     Breath sounds: Normal breath sounds. No wheezing, rhonchi or rales.  Abdominal:     General: Bowel sounds are normal.     Palpations: Abdomen is soft.  Musculoskeletal:        General: Normal range of motion.  Neurological:     Mental Status: She is alert.      UC Treatments / Results  Labs (all labs ordered are listed, but only abnormal results are displayed) Labs Reviewed  RESP PANEL BY RT-PCR (FLU A&B, COVID) [id number]  EKG   Radiology No results found.  Procedures Procedures (including critical care time)  Medications Ordered in  UC Medications - No data to display  Initial Impression / Assessment and Plan / UC Course  I have reviewed the triage vital signs and the nursing notes.  Pertinent labs & imaging results that were available during my care of the patient were reviewed by me and considered in my medical decision making (see chart for details).     1.  Viral respiratory infection: Tessalon Perles as needed for cough Maintain adequate hydration COVID-19/flu a plus B PCR test has been sent Humidifier and vapor rub use will help with nasal symptoms We will call patient with labs if abnormal Return precautions given. Patient is advised to quarantine until COVID-19 test results are available. Final Clinical Impressions(s) / UC Diagnoses   Final diagnoses:  Viral respiratory infection     Discharge Instructions      Humidifier use will help with nasal congestion and chest congestion VapoRub use will help with chest congestion and cough Take medications as prescribed We will call you with recommendation if labs are abnormal Your lung exam is unremarkable Maintain adequate hydration Return to urgent care if symptoms persist or worsens.   ED Prescriptions     Medication Sig Dispense Auth. Provider   benzonatate (TESSALON) 100 MG capsule Take 1 capsule (100 mg total) by mouth 3 (three) times daily as needed for cough. 30 capsule Luisalberto Beegle, Britta Mccreedy, MD   guaiFENesin (MUCINEX) 600 MG 12 hr tablet Take 1 tablet (600 mg total) by mouth 2 (two) times daily for 10 days. 20 tablet Lamine Laton, Britta Mccreedy, MD      PDMP not reviewed this encounter.   Merrilee Jansky, MD 04/26/22 1300

## 2022-04-26 NOTE — ED Triage Notes (Signed)
Pt states she has  dry cough, congestion, and low grade fever started Monday. She is worried about having pneumonia her daughter just finished antibiotics. She has been taking organic cough med and thera flu at night to sleep.

## 2022-04-27 ENCOUNTER — Ambulatory Visit: Payer: BC Managed Care – PPO | Admitting: Nurse Practitioner

## 2022-08-21 DIAGNOSIS — L821 Other seborrheic keratosis: Secondary | ICD-10-CM | POA: Diagnosis not present

## 2022-08-21 DIAGNOSIS — D235 Other benign neoplasm of skin of trunk: Secondary | ICD-10-CM | POA: Diagnosis not present

## 2022-09-24 ENCOUNTER — Encounter: Payer: BC Managed Care – PPO | Admitting: Internal Medicine

## 2022-10-15 ENCOUNTER — Ambulatory Visit (INDEPENDENT_AMBULATORY_CARE_PROVIDER_SITE_OTHER): Payer: BC Managed Care – PPO | Admitting: Internal Medicine

## 2022-10-15 ENCOUNTER — Encounter: Payer: Self-pay | Admitting: Internal Medicine

## 2022-10-15 VITALS — BP 122/80 | HR 70 | Temp 97.7°F | Ht 61.0 in | Wt 150.0 lb

## 2022-10-15 DIAGNOSIS — Z0000 Encounter for general adult medical examination without abnormal findings: Secondary | ICD-10-CM

## 2022-10-15 DIAGNOSIS — Z7184 Encounter for health counseling related to travel: Secondary | ICD-10-CM

## 2022-10-15 DIAGNOSIS — Z1211 Encounter for screening for malignant neoplasm of colon: Secondary | ICD-10-CM | POA: Diagnosis not present

## 2022-10-15 DIAGNOSIS — R1013 Epigastric pain: Secondary | ICD-10-CM | POA: Diagnosis not present

## 2022-10-15 MED ORDER — PSEUDOEPHEDRINE HCL ER 120 MG PO TB12: 120.0000 mg | ORAL_TABLET | Freq: Two times a day (BID) | ORAL | 0 refills | Status: AC | PRN

## 2022-10-15 MED ORDER — CIPROFLOXACIN HCL 500 MG PO TABS: 500.0000 mg | ORAL_TABLET | Freq: Two times a day (BID) | ORAL | 0 refills | Status: AC

## 2022-10-15 MED ORDER — PANTOPRAZOLE SODIUM 40 MG PO TBEC: 40.0000 mg | DELAYED_RELEASE_TABLET | Freq: Every day | ORAL | 3 refills | Status: AC

## 2022-10-15 NOTE — Assessment & Plan Note (Signed)
S/p chole Protonix prn Digestive enzymes

## 2022-10-15 NOTE — Assessment & Plan Note (Signed)
Start Protonix GI ref was offered

## 2022-10-15 NOTE — Assessment & Plan Note (Addendum)
  We discussed age appropriate health related issues, including available/recomended screening tests and vaccinations. Labs were ordered to be later reviewed . All questions were answered. We discussed one or more of the following - seat belt use, use of sunscreen/sun exposure exercise, fall risk reduction, second hand smoke exposure, firearm use and storage, seat belt use, a need for adhering to healthy diet and exercise. Labs were ordered.  All questions were answered. Colonoscopy or cologuard advised Ophth q 2 wks GYN q 12 mo

## 2022-10-15 NOTE — Assessment & Plan Note (Signed)
[place] 2024 Cipro Rx

## 2022-10-15 NOTE — Progress Notes (Signed)
Subjective:  Patient ID: Sue Watkins, female    DOB: 27-Sep-1970  Age: 52 y.o. MRN: [id number]  CC: No chief complaint on file.   HPI TRANNIE BARDALES presents for a well exam Occ GERD sx's   Outpatient Medications Prior to Visit  Medication Sig Dispense Refill   benzonatate (TESSALON) 100 MG capsule Take 1 capsule (100 mg total) by mouth 3 (three) times daily as needed for cough. 30 capsule 0   No facility-administered medications prior to visit.    ROS: Review of Systems  Constitutional:  Negative for activity change, appetite change, chills, fatigue and unexpected weight change.  HENT:  Negative for congestion, mouth sores and sinus pressure.   Eyes:  Negative for visual disturbance.  Respiratory:  Negative for cough and chest tightness.   Gastrointestinal:  Negative for abdominal pain and nausea.  Genitourinary:  Negative for difficulty urinating, frequency and vaginal pain.  Musculoskeletal:  Negative for back pain and gait problem.  Skin:  Negative for pallor and rash.  Neurological:  Negative for dizziness, tremors, weakness, numbness and headaches.  Psychiatric/Behavioral:  Negative for confusion and sleep disturbance.     Objective:  BP 122/80 (BP Location: Right Arm, Patient Position: Sitting, Cuff Size: Normal)   Pulse 70   Temp 97.7 F (36.5 C) (Oral)   Ht  (1.549 m)   Wt 150 lb (68 kg)   SpO2 97%   BMI 28.34 kg/m   BP Readings from Last 3 Encounters:  10/15/22 122/80  04/26/22 137/88  12/29/21 108/64    Wt Readings from Last 3 Encounters:  10/15/22 150 lb (68 kg)  12/18/21 148 lb 3.2 oz (67.2 kg)  10/26/21 144 lb (65.3 kg)    Physical Exam Constitutional:      General: She is not in acute distress.    Appearance: She is well-developed. She is obese.  HENT:     Head: Normocephalic.     Right Ear: External ear normal.     Left Ear: External ear normal.     Nose: Nose normal.  Eyes:     General:        Right eye: No discharge.         Left eye: No discharge.     Conjunctiva/sclera: Conjunctivae normal.     Pupils: Pupils are equal, round, and reactive to light.  Neck:     Thyroid: No thyromegaly.     Vascular: No JVD.     Trachea: No tracheal deviation.  Cardiovascular:     Rate and Rhythm: Normal rate and regular rhythm.     Heart sounds: Normal heart sounds.  Pulmonary:     Effort: No respiratory distress.     Breath sounds: No stridor. No wheezing.  Abdominal:     General: Bowel sounds are normal. There is no distension.     Palpations: Abdomen is soft. There is no mass.     Tenderness: There is no abdominal tenderness. There is no guarding or rebound.  Musculoskeletal:        General: No tenderness.     Cervical back: Normal range of motion and neck supple. No rigidity.  Lymphadenopathy:     Cervical: No cervical adenopathy.  Skin:    Findings: No erythema or rash.  Neurological:     Cranial Nerves: No cranial nerve deficit.     Motor: No abnormal muscle tone.     Coordination: Coordination normal.     Deep Tendon Reflexes: Reflexes normal.  Psychiatric:        Behavior: Behavior normal.        Thought Content: Thought content normal.        Judgment: Judgment normal.     Lab Results  Component Value Date   WBC 6.8 12/18/2021   HGB 14.2 12/18/2021   HCT 41.9 12/18/2021   PLT 228 12/18/2021   GLUCOSE 94 11/10/2021   CHOL 154 11/10/2021   TRIG 80.0 11/10/2021   HDL 44.10 11/10/2021   LDLCALC 94 11/10/2021   ALT 21 11/10/2021   AST 17 11/10/2021   NA 140 11/10/2021   K 4.0 11/10/2021   CL 105 11/10/2021   CREATININE 0.79 11/10/2021   BUN 18 11/10/2021   CO2 28 11/10/2021   TSH 1.13 11/10/2021    No results found.  Assessment & Plan:   Problem List Items Addressed This Visit       Other   Epigastric pain    S/p chole Protonix prn Digestive enzymes      Travel advice encounter    [place] 2024 Cipro Rx      Well adult exam - Primary     We discussed age appropriate  health related issues, including available/recomended screening tests and vaccinations. Labs were ordered to be later reviewed . All questions were answered. We discussed one or more of the following - seat belt use, use of sunscreen/sun exposure exercise, fall risk reduction, second hand smoke exposure, firearm use and storage, seat belt use, a need for adhering to healthy diet and exercise. Labs were ordered.  All questions were answered. Colonoscopy or cologuard advised Ophth q 2 wks GYN q 12 mo      Relevant Orders   TSH   Urinalysis   CBC with Differential/Platelet   Lipid panel   Comprehensive metabolic panel   Cologuard   Other Visit Diagnoses     Screening for colon cancer       Relevant Orders   Cologuard         Meds ordered this encounter  Medications   pseudoephedrine (SUDAFED) 120 MG 12 hr tablet    Sig: Take 1 tablet (120 mg total) by mouth 2 (two) times daily as needed for congestion.    Dispense:  60 tablet    Refill:  0   pantoprazole (PROTONIX) 40 MG tablet    Sig: Take 1 tablet (40 mg total) by mouth daily.    Dispense:  30 tablet    Refill:  3   ciprofloxacin (CIPRO) 500 MG tablet    Sig: Take 1 tablet (500 mg total) by mouth 2 (two) times daily for 10 days.    Dispense:  20 tablet    Refill:  0      Follow-up: Return in about 3 months (around 01/14/2023) for a follow-up visit.  Sonda Primes, MD

## 2022-11-13 ENCOUNTER — Other Ambulatory Visit (INDEPENDENT_AMBULATORY_CARE_PROVIDER_SITE_OTHER): Payer: BC Managed Care – PPO

## 2022-11-13 DIAGNOSIS — Z0000 Encounter for general adult medical examination without abnormal findings: Secondary | ICD-10-CM | POA: Diagnosis not present

## 2022-11-13 LAB — URINALYSIS
Bilirubin Urine: NEGATIVE
Ketones, ur: NEGATIVE
Leukocytes,Ua: NEGATIVE
Nitrite: NEGATIVE
Specific Gravity, Urine: >=1.03 — AB (ref 1.000–1.030)
Total Protein, Urine: NEGATIVE
Urine Glucose: NEGATIVE
Urobilinogen, UA: 0.2 (ref 0.0–1.0)
pH: 6 (ref 5.0–8.0)

## 2022-11-13 LAB — LIPID PANEL
Cholesterol: 152 mg/dL (ref 0–200)
HDL: 43.7 mg/dL (ref >39.00)
LDL Cholesterol: 90 mg/dL (ref 0–99)
NonHDL: 108.43
Total CHOL/HDL Ratio: 3
Triglycerides: 92 mg/dL (ref 0.0–149.0)
VLDL: 18.4 mg/dL (ref 0.0–40.0)

## 2022-11-13 LAB — COMPREHENSIVE METABOLIC PANEL
ALT: 26 U/L (ref 0–35)
AST: 19 U/L (ref 0–37)
Albumin: 4.1 g/dL (ref 3.5–5.2)
Alkaline Phosphatase: 60 U/L (ref 39–117)
BUN: 18 mg/dL (ref 6–23)
CO2: 27 mEq/L (ref 19–32)
Calcium: 9.3 mg/dL (ref 8.4–10.5)
Chloride: 105 mEq/L (ref 96–112)
Creatinine, Ser: 0.69 mg/dL (ref 0.40–1.20)
GFR: 101.55 mL/min (ref >60.00)
Glucose, Bld: 92 mg/dL (ref 70–99)
Potassium: 4 mEq/L (ref 3.5–5.1)
Sodium: 140 mEq/L (ref 135–145)
Total Bilirubin: 0.4 mg/dL (ref 0.2–1.2)
Total Protein: 7.3 g/dL (ref 6.0–8.3)

## 2022-11-13 LAB — CBC WITH DIFFERENTIAL/PLATELET
Basophils Absolute: 0 10*3/uL (ref 0.0–0.1)
Basophils Relative: 0.6 % (ref 0.0–3.0)
Eosinophils Absolute: 0.2 10*3/uL (ref 0.0–0.7)
Eosinophils Relative: 2.2 % (ref 0.0–5.0)
HCT: 42.8 % (ref 36.0–46.0)
Hemoglobin: 13.9 g/dL (ref 12.0–15.0)
Lymphocytes Relative: 30.4 % (ref 12.0–46.0)
Lymphs Abs: 2.3 10*3/uL (ref 0.7–4.0)
MCHC: 32.5 g/dL (ref 30.0–36.0)
MCV: 92.4 fl (ref 78.0–100.0)
Monocytes Absolute: 0.6 10*3/uL (ref 0.1–1.0)
Monocytes Relative: 7.6 % (ref 3.0–12.0)
Neutro Abs: 4.5 10*3/uL (ref 1.4–7.7)
Neutrophils Relative %: 59.2 % (ref 43.0–77.0)
Platelets: 238 10*3/uL (ref 150.0–400.0)
RBC: 4.64 Mil/uL (ref 3.87–5.11)
RDW: 13.9 % (ref 11.5–15.5)
WBC: 7.5 10*3/uL (ref 4.0–10.5)

## 2022-11-13 LAB — TSH: TSH: 1.65 u[IU]/mL (ref 0.35–5.50)

## 2022-11-20 ENCOUNTER — Encounter: Payer: Self-pay | Admitting: Internal Medicine

## 2022-12-18 ENCOUNTER — Encounter: Payer: Self-pay | Admitting: Internal Medicine

## 2022-12-19 ENCOUNTER — Other Ambulatory Visit: Payer: Self-pay | Admitting: Internal Medicine

## 2022-12-19 DIAGNOSIS — R1013 Epigastric pain: Secondary | ICD-10-CM

## 2023-03-06 NOTE — Progress Notes (Signed)
52 y.o. G36P1011 Married Caucasian female here for annual exam.    Periods are spreading out.  May had a normal period. Period was less in August.  September had a normal period.   Not much hot flashes.  Less this year than last year.   Had dilation and curettage for endometrial polyps 12/18/21.  Final pathology - benign endometrial polyp and proliferative endometrium.   From [place].  Economics professor.  PCP:   Dr. Shirl Harris  Patient's last menstrual period was 03/11/2023.     Period Duration (Days): 7 Period Pattern: (!) Irregular Menstrual Flow: Moderate Menstrual Control: Maxi pad Dysmenorrhea: None     Sexually active: Yes.    The current method of family planning is none.    Exercising: Yes.     pilates Smoker:  no  Health Maintenance: Pap:  10/26/21 ASCUS:HR HPV neg History of abnormal Pap:  yes MMG:  3 years ago per [facility] Colonoscopy:  n/a BMD:   n/a  Result  n/a TDaP:  unsure, most likely had one in the last 5 years Gardasil:   no HIV: n/a Hep C: n/a Screening Labs:  PCP   reports that she has never smoked. She has never used smokeless tobacco. She reports current alcohol use. She reports that she does not use drugs.  Past Medical History:  Diagnosis Date   Benign cyst of left kidney    per 11-09-2021 [place] in epic   H pylori ulcer    Hepatitis    as a child issue resolved   PONV (postoperative nausea and vomiting)    with cholecysectomy   Wears glasses or contact lenses     Past Surgical History:  Procedure Laterality Date   CHOLECYSTECTOMY N/A 04/14/2021   Procedure: LAPAROSCOPIC CHOLECYSTECTOMY;  Surgeon: Fritzi Mandes, MD;  Location: [facility] OR;  Service: General;  Laterality: N/A;   DILATATION & CURETTAGE/HYSTEROSCOPY WITH MYOSURE N/A 12/18/2021   Procedure: DILATATION & CURETTAGE/HYSTEROSCOPY WITH MYOSURE;  Surgeon: Genia Del, MD;  Location: [facility];  Service: Gynecology;  Laterality: N/A;   egg retrieval surgery     5 to 7  yrs ago per pt on 12-14-2021   uterine polyp removed     5  to 7 yrs ago per pt on 12-14-2021    Current Outpatient Medications  Medication Sig Dispense Refill   pantoprazole (PROTONIX) 40 MG tablet Take 1 tablet (40 mg total) by mouth daily. 30 tablet 3   pseudoephedrine (SUDAFED) 120 MG 12 hr tablet Take 1 tablet (120 mg total) by mouth 2 (two) times daily as needed for congestion. 60 tablet 0   No current facility-administered medications for this visit.    Family History  Problem Relation Age of Onset   Hypertension Mother    Thyroid cancer Mother        53s   Heart attack Mother 44       MI   Cancer Father 37       lung cancer   Hypertension Sister    Diabetes Sister    Breast cancer Sister    Uterine cancer Sister    Stomach cancer Maternal Grandmother    Diabetes Paternal Grandmother     Review of Systems  All other systems reviewed and are negative.   Exam:   BP 130/82 (BP Location: Right Arm, Patient Position: Sitting, Cuff Size: Normal)   Pulse 80   Ht 5' 4.5" (1.638 m)   Wt 148 lb (67.1 kg)   LMP  03/11/2023   SpO2 96%   BMI 25.01 kg/m     General appearance: alert, cooperative and appears stated age Head: normocephalic, without obvious abnormality, atraumatic Neck: no adenopathy, supple, symmetrical, trachea midline and thyroid normal to inspection and palpation Lungs: clear to auscultation bilaterally Breasts: normal appearance, no masses or tenderness, No nipple retraction or dimpling, No nipple discharge or bleeding, No axillary adenopathy Heart: regular rate and rhythm Abdomen: soft, non-tender; no masses, no organomegaly Extremities: extremities normal, atraumatic, no cyanosis or edema Skin: skin color, texture, turgor normal. No rashes or lesions Lymph nodes: cervical, supraclavicular, and axillary nodes normal. Neurologic: grossly normal  Pelvic: External genitalia:  no lesions              No abnormal inguinal nodes palpated.               Urethra:  normal appearing urethra with no masses, tenderness or lesions              Bartholins and Skenes: normal                 Vagina: normal appearing vagina with normal color and discharge, no lesions              Cervix: no lesions              Pap taken: no Bimanual Exam:  Uterus:  normal size, contour, position, consistency, mobility, non-tender              Adnexa: no mass, fullness, tenderness              Rectal exam: yes.  Confirms.              Anus:  normal sphincter tone, no lesions  Chaperone was present for exam:  Warren Lacy, CMA  Assessment:   Well woman visit with gynecologic exam. Hx ASCUS, neg HR HPV pap in 2023.  Colon cancer screening.   Plan: Mammogram screening discussed.  She will schedule.  Self breast awareness reviewed. Pap and HR HPV 2026 Guidelines for Calcium, Vitamin D, regular exercise program including cardiovascular and weight bearing exercise. Cologuard ordered.  Labs with PCP.  Flu and Covid vaccine discussed. Follow up annually and prn.

## 2023-03-20 ENCOUNTER — Ambulatory Visit (INDEPENDENT_AMBULATORY_CARE_PROVIDER_SITE_OTHER): Payer: BC Managed Care – PPO | Admitting: Obstetrics and Gynecology

## 2023-03-20 ENCOUNTER — Encounter: Payer: Self-pay | Admitting: Obstetrics and Gynecology

## 2023-03-20 VITALS — BP 130/82 | HR 80 | Ht 64.5 in | Wt 148.0 lb

## 2023-03-20 DIAGNOSIS — Z01419 Encounter for gynecological examination (general) (routine) without abnormal findings: Secondary | ICD-10-CM

## 2023-03-20 DIAGNOSIS — Z1211 Encounter for screening for malignant neoplasm of colon: Secondary | ICD-10-CM

## 2023-03-20 NOTE — Patient Instructions (Signed)

## 2023-04-03 DIAGNOSIS — Z1211 Encounter for screening for malignant neoplasm of colon: Secondary | ICD-10-CM | POA: Diagnosis not present

## 2023-04-09 LAB — COLOGUARD: COLOGUARD: NEGATIVE

## 2023-06-23 IMAGING — US US ABDOMEN COMPLETE
1 series · 13 of 25 positions shown · non-contrast
Comparison: None.

CLINICAL DATA: Upper abdominal pain. Nausea, fever, vomiting
episode

EXAM:
ABDOMEN ULTRASOUND COMPLETE

[Series 2: us abdomen complete · 0.17mm/px · 13 of 101 slices shown]
[im 1/101]
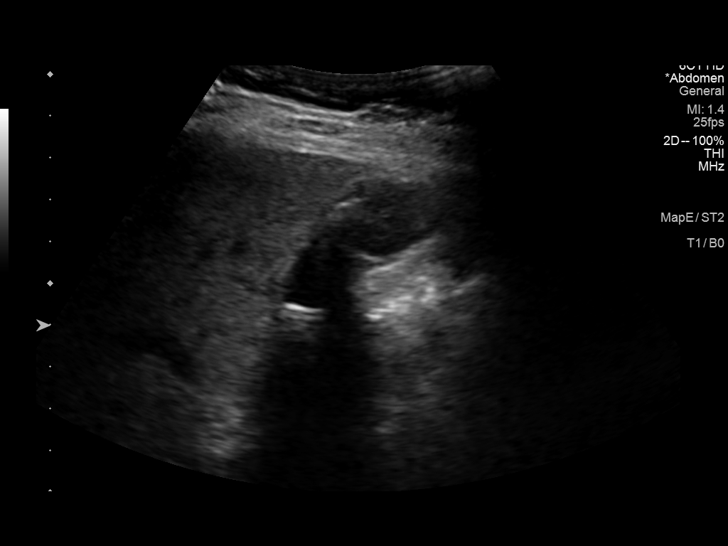
[im 9/101]
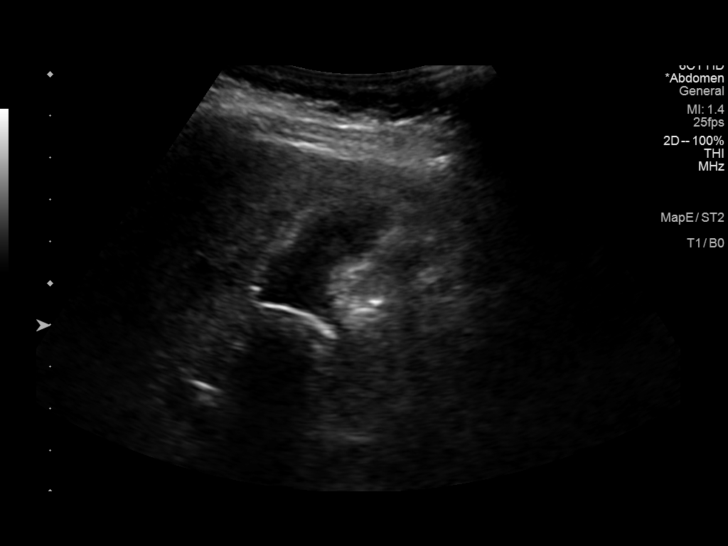
[im 17/101]
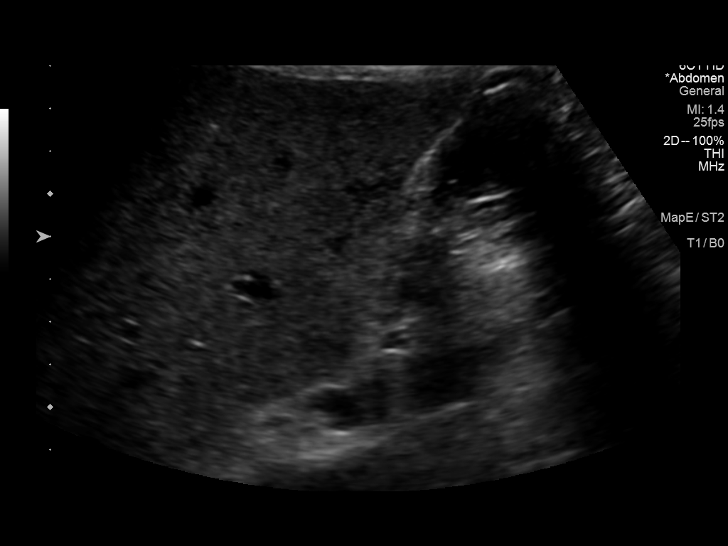
[im 26/101]
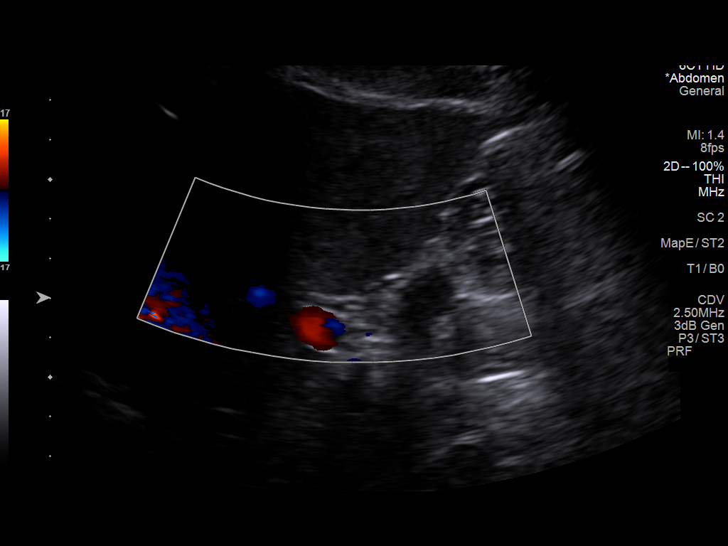
[im 34/101]
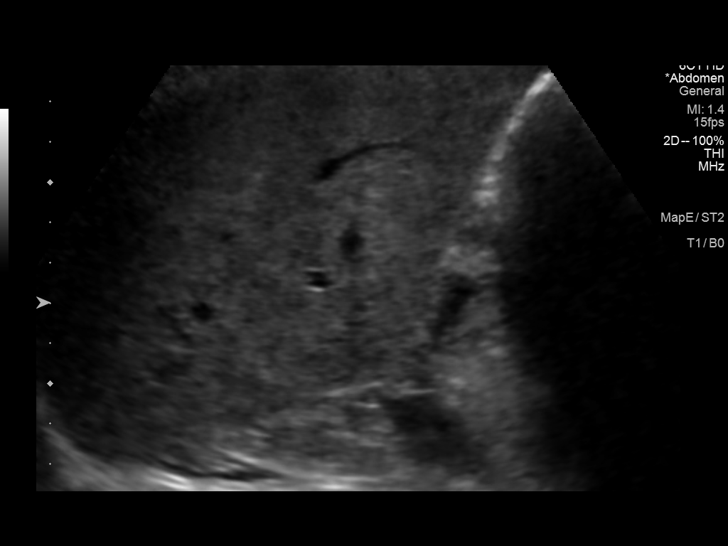
[im 42/101]
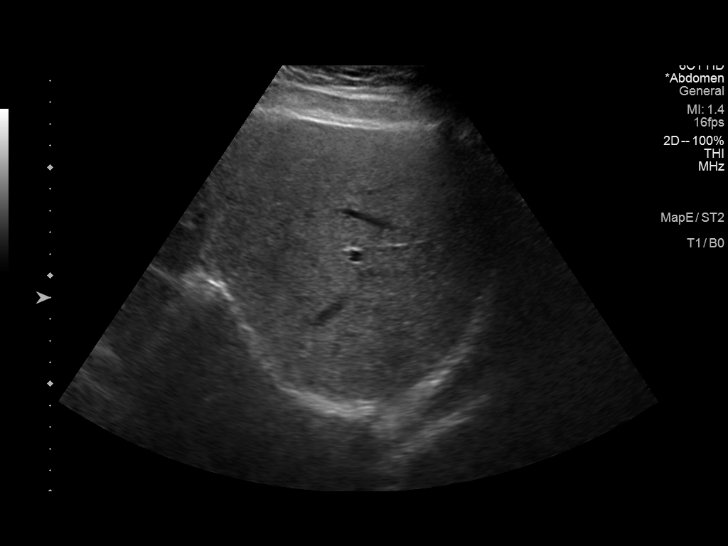
[im 51/101]
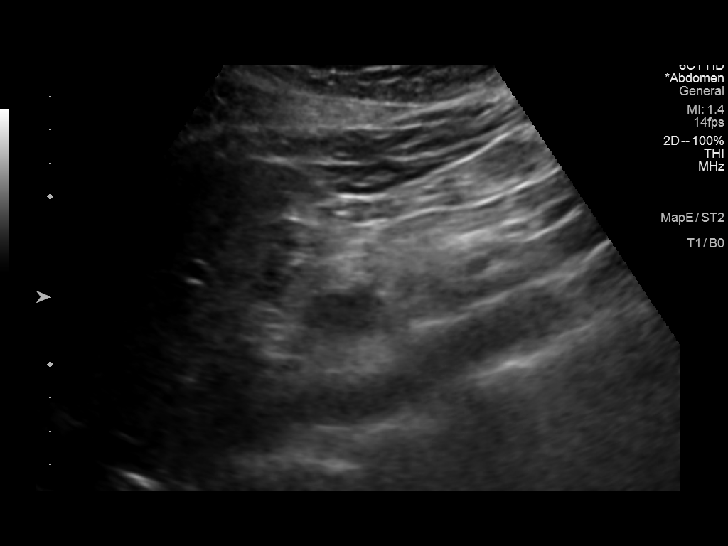
[im 59/101]
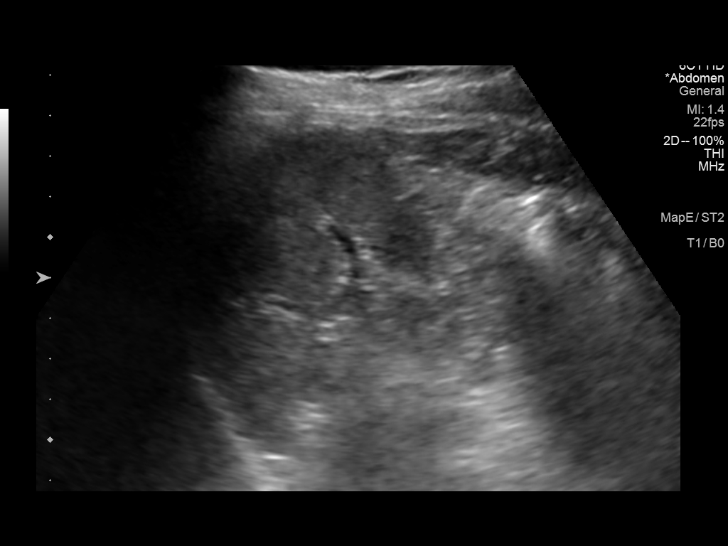
[im 67/101]
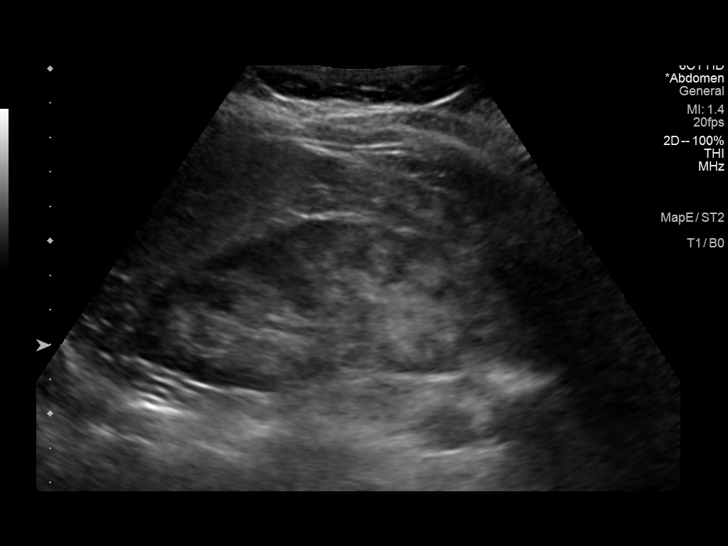
[im 76/101]
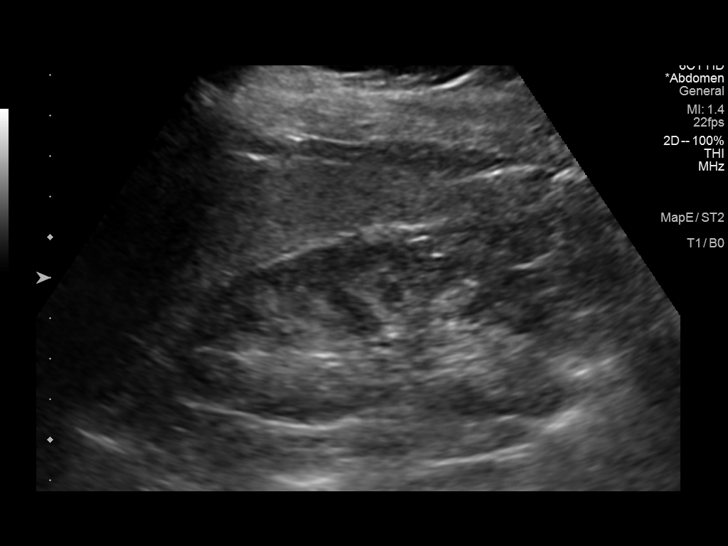
[im 84/101]
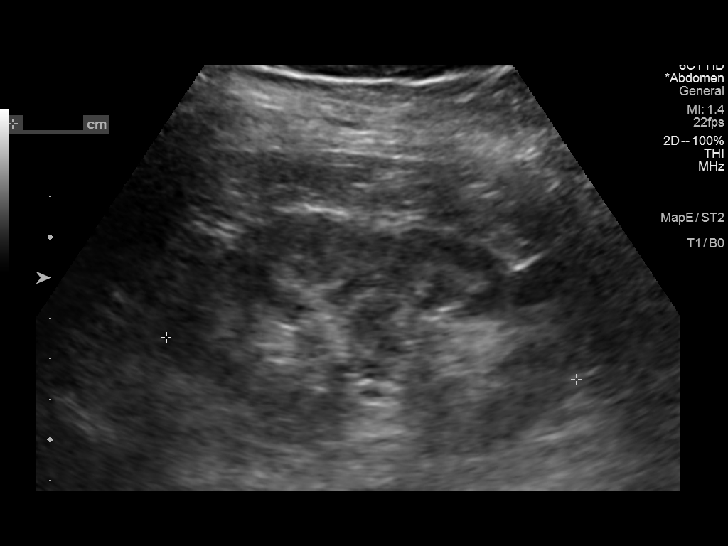
[im 92/101]
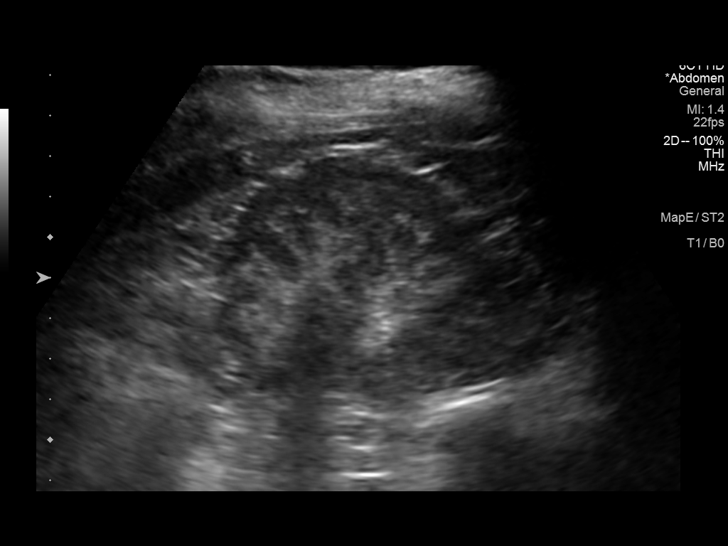
[im 101/101]
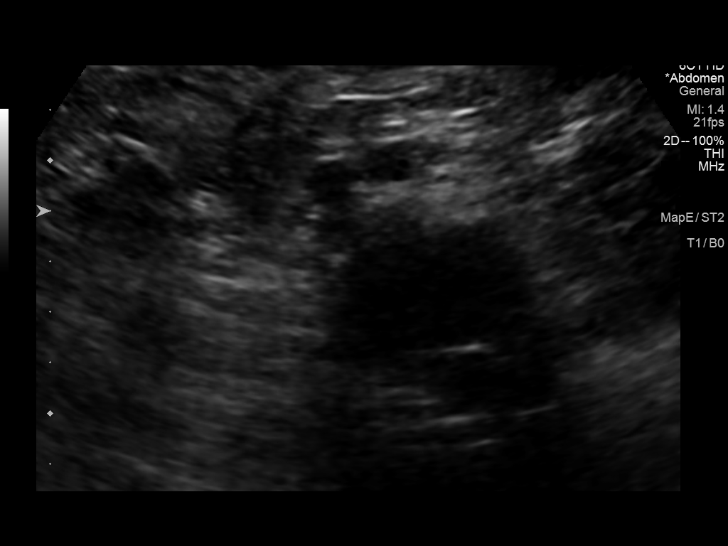

[13 of 25 positions shown; findings below may reference images not displayed]

FINDINGS: Gallbladder: Calcified stone within the gallbladder lumen measuring
up to at least 2 cm. No wall thickening or pericholecystic fluid
visualized. No sonographic Murphy sign noted by sonographer.

Common bile duct: Diameter: 3 mm.

Liver: No focal lesion identified. Increased parenchymal
echogenicity. Portal vein is patent on color Doppler imaging with
normal direction of blood flow towards the liver.

IVC: No abnormality visualized.

Pancreas: Visualized portion unremarkable.

Spleen: Size and appearance within normal limits.

Right Kidney: Length: 11.3 cm. Echogenicity within normal limits. No
mass or hydronephrosis visualized.

Left Kidney: Length: 10.2 cm. Echogenicity within normal limits.
There is a 1.4 x 1 x 1.3 cm lesion within the inferior pole of the
left kidney no that is poorly visualized and possibly solid. No
hydronephrosis visualized.

Abdominal aorta: No aneurysm visualized.

Other findings: None.
IMPRESSION: 1. Cholelithiasis with no findings of acute cholecystitis.
2. Hepatic steatosis. Please note limited evaluation for focal
hepatic masses in a patient with hepatic steatosis due to decreased
penetration of the acoustic ultrasound waves.
3. A 1.4 x 1 x 1.3 cm left inferior renal pole lesion that may be
solid and that is incompletely evaluated. Recommend correlation with
prior cross-sectional imaging. If clinically indicated, consider MRI
renal protocol for further evaluation.

## 2024-03-06 ENCOUNTER — Other Ambulatory Visit: Payer: Self-pay | Admitting: Internal Medicine

## 2024-03-06 DIAGNOSIS — Z1231 Encounter for screening mammogram for malignant neoplasm of breast: Secondary | ICD-10-CM

## 2024-03-19 ENCOUNTER — Encounter: Payer: Self-pay | Admitting: Internal Medicine

## 2024-03-19 ENCOUNTER — Ambulatory Visit: Admitting: Internal Medicine

## 2024-03-19 VITALS — BP 122/80 | HR 66 | Ht 64.5 in | Wt 151.8 lb

## 2024-03-19 DIAGNOSIS — Z0000 Encounter for general adult medical examination without abnormal findings: Secondary | ICD-10-CM

## 2024-03-19 MED ORDER — VITAMIN D (ERGOCALCIFEROL) 1.25 MG (50000 UNIT) PO CAPS: 50000.0000 [IU] | ORAL_CAPSULE | ORAL | 3 refills | Status: AC

## 2024-03-19 NOTE — Assessment & Plan Note (Signed)
  We discussed age appropriate health related issues, including available/recomended screening tests and vaccinations. Labs were ordered to be later reviewed . All questions were answered. We discussed one or more of the following - seat belt use, use of sunscreen/sun exposure exercise, fall risk reduction, second hand smoke exposure, firearm use and storage, seat belt use, a need for adhering to healthy diet and exercise. Labs were ordered.  All questions were answered. (-) cologuard advised 2024 Ophth q 2 wks GYN q 12 mo

## 2024-03-19 NOTE — Progress Notes (Signed)
 Subjective:  Patient ID: Sue Watkins, female    DOB: March 15, 1971  Age: 53 y.o. MRN: [id number]  CC: Annual Exam   HPI Sue Watkins presents for a well exam  Outpatient Medications Prior to Visit  Medication Sig Dispense Refill   pantoprazole  (PROTONIX ) 40 MG tablet Take 1 tablet (40 mg total) by mouth daily. (Patient not taking: Reported on 03/19/2024) 30 tablet 3   No facility-administered medications prior to visit.    ROS: Review of Systems  Constitutional:  Negative for activity change, appetite change, chills, fatigue and unexpected weight change.  HENT:  Negative for congestion, mouth sores and sinus pressure.   Eyes:  Negative for visual disturbance.  Respiratory:  Negative for cough and chest tightness.   Gastrointestinal:  Negative for abdominal pain and nausea.  Genitourinary:  Negative for difficulty urinating, frequency and vaginal pain.  Musculoskeletal:  Negative for back pain and gait problem.  Skin:  Negative for pallor and rash.  Neurological:  Negative for dizziness, tremors, weakness, numbness and headaches.  Psychiatric/Behavioral:  Negative for confusion and sleep disturbance.     Objective:  BP 122/80 (BP Location: Left Arm, Patient Position: Sitting)   Pulse 66   Ht 5' 4.5 (1.638 m)   Wt 151 lb 12.8 oz (68.9 kg)   SpO2 97%   BMI 25.65 kg/m   BP Readings from Last 3 Encounters:  03/19/24 122/80  03/20/23 130/82  10/15/22 122/80    Wt Readings from Last 3 Encounters:  03/19/24 151 lb 12.8 oz (68.9 kg)  03/20/23 148 lb (67.1 kg)  10/15/22 150 lb (68 kg)    Physical Exam Constitutional:      General: She is not in acute distress.    Appearance: She is well-developed.  HENT:     Head: Normocephalic.     Right Ear: External ear normal.     Left Ear: External ear normal.     Nose: Nose normal.  Eyes:     General:        Right eye: No discharge.        Left eye: No discharge.     Conjunctiva/sclera: Conjunctivae normal.      Pupils: Pupils are equal, round, and reactive to light.  Neck:     Thyroid : No thyromegaly.     Vascular: No JVD.     Trachea: No tracheal deviation.  Cardiovascular:     Rate and Rhythm: Normal rate and regular rhythm.     Heart sounds: Normal heart sounds.  Pulmonary:     Effort: No respiratory distress.     Breath sounds: No stridor. No wheezing.  Abdominal:     General: Bowel sounds are normal. There is no distension.     Palpations: Abdomen is soft. There is no mass.     Tenderness: There is no abdominal tenderness. There is no guarding or rebound.  Musculoskeletal:        General: No tenderness.     Cervical back: Normal range of motion and neck supple. No rigidity.     Right lower leg: No edema.     Left lower leg: No edema.  Lymphadenopathy:     Cervical: No cervical adenopathy.  Skin:    Findings: No erythema or rash.  Neurological:     Mental Status: She is oriented to person, place, and time.     Cranial Nerves: No cranial nerve deficit.     Motor: No abnormal muscle tone.  Coordination: Coordination normal.     Gait: Gait normal.     Deep Tendon Reflexes: Reflexes normal.  Psychiatric:        Behavior: Behavior normal.        Thought Content: Thought content normal.        Judgment: Judgment normal.     Lab Results  Component Value Date   WBC 7.5 11/13/2022   HGB 13.9 11/13/2022   HCT 42.8 11/13/2022   PLT 238.0 11/13/2022   GLUCOSE 92 11/13/2022   CHOL 152 11/13/2022   TRIG 92.0 11/13/2022   HDL 43.70 11/13/2022   LDLCALC 90 11/13/2022   ALT 26 11/13/2022   AST 19 11/13/2022   NA 140 11/13/2022   K 4.0 11/13/2022   CL 105 11/13/2022   CREATININE 0.69 11/13/2022   BUN 18 11/13/2022   CO2 27 11/13/2022   TSH 1.65 11/13/2022    No results found.  Assessment & Plan:   Problem List Items Addressed This Visit     Well adult exam - Primary    We discussed age appropriate health related issues, including available/recomended screening tests  and vaccinations. Labs were ordered to be later reviewed . All questions were answered. We discussed one or more of the following - seat belt use, use of sunscreen/sun exposure exercise, fall risk reduction, second hand smoke exposure, firearm use and storage, seat belt use, a need for adhering to healthy diet and exercise. Labs were ordered.  All questions were answered. (-) cologuard advised 2024 Ophth q 2 wks GYN q 12 mo      Relevant Orders   TSH   Urinalysis   CBC with Differential/Platelet   Lipid panel   Comprehensive metabolic panel with GFR      Meds ordered this encounter  Medications   Vitamin D , Ergocalciferol , (DRISDOL ) 1.25 MG (50000 UNIT) CAPS capsule    Sig: Take 1 capsule (50,000 Units total) by mouth every 30 (thirty) days.    Dispense:  3 capsule    Refill:  3      Follow-up: Return in about 1 year (around 03/19/2025) for a follow-up visit.  Marolyn Noel, MD

## 2024-03-23 ENCOUNTER — Encounter: Admitting: Internal Medicine

## 2024-03-27 ENCOUNTER — Encounter

## 2024-04-03 ENCOUNTER — Ambulatory Visit
Admission: RE | Admit: 2024-04-03 | Discharge: 2024-04-03 | Disposition: A | Discharge status: Home / Self Care | Source: Ambulatory Visit

## 2024-04-03 DIAGNOSIS — Z1231 Encounter for screening mammogram for malignant neoplasm of breast: Secondary | ICD-10-CM

## 2024-04-08 ENCOUNTER — Encounter: Payer: Self-pay | Admitting: Internal Medicine

## 2024-04-22 ENCOUNTER — Other Ambulatory Visit

## 2024-04-22 ENCOUNTER — Ambulatory Visit: Payer: Self-pay | Admitting: Internal Medicine

## 2024-04-22 DIAGNOSIS — Z0000 Encounter for general adult medical examination without abnormal findings: Secondary | ICD-10-CM | POA: Diagnosis not present

## 2024-04-22 LAB — COMPREHENSIVE METABOLIC PANEL WITH GFR
ALT: 29 U/L (ref 0–35)
AST: 19 U/L (ref 0–37)
Albumin: 4.3 g/dL (ref 3.5–5.2)
Alkaline Phosphatase: 67 U/L (ref 39–117)
BUN: 15 mg/dL (ref 6–23)
CO2: 24 meq/L (ref 19–32)
Calcium: 9.3 mg/dL (ref 8.4–10.5)
Chloride: 105 meq/L (ref 96–112)
Creatinine, Ser: 0.71 mg/dL (ref 0.40–1.20)
GFR: 98.49 mL/min (ref >60.00)
Glucose, Bld: 94 mg/dL (ref 70–99)
Potassium: 3.9 meq/L (ref 3.5–5.1)
Sodium: 137 meq/L (ref 135–145)
Total Bilirubin: 0.5 mg/dL (ref 0.2–1.2)
Total Protein: 7.4 g/dL (ref 6.0–8.3)

## 2024-04-22 LAB — CBC WITH DIFFERENTIAL/PLATELET
Basophils Absolute: 0 K/uL (ref 0.0–0.1)
Basophils Relative: 0.5 % (ref 0.0–3.0)
Eosinophils Absolute: 0.1 K/uL (ref 0.0–0.7)
Eosinophils Relative: 1.8 % (ref 0.0–5.0)
HCT: 42.7 % (ref 36.0–46.0)
Hemoglobin: 14.1 g/dL (ref 12.0–15.0)
Lymphocytes Relative: 36.1 % (ref 12.0–46.0)
Lymphs Abs: 2.5 K/uL (ref 0.7–4.0)
MCHC: 33.1 g/dL (ref 30.0–36.0)
MCV: 90.6 fl (ref 78.0–100.0)
Monocytes Absolute: 0.5 K/uL (ref 0.1–1.0)
Monocytes Relative: 7.7 % (ref 3.0–12.0)
Neutro Abs: 3.8 K/uL (ref 1.4–7.7)
Neutrophils Relative %: 53.9 % (ref 43.0–77.0)
Platelets: 234 K/uL (ref 150.0–400.0)
RBC: 4.72 Mil/uL (ref 3.87–5.11)
RDW: 13.7 % (ref 11.5–15.5)
WBC: 7 K/uL (ref 4.0–10.5)

## 2024-04-22 LAB — URINALYSIS, ROUTINE W REFLEX MICROSCOPIC
Bilirubin Urine: NEGATIVE
Hgb urine dipstick: NEGATIVE
Ketones, ur: NEGATIVE
Nitrite: NEGATIVE
Specific Gravity, Urine: >=1.03 — AB (ref 1.000–1.030)
Total Protein, Urine: NEGATIVE
Urine Glucose: NEGATIVE
Urobilinogen, UA: 0.2 (ref 0.0–1.0)
pH: 5 (ref 5.0–8.0)

## 2024-04-22 LAB — LIPID PANEL
Cholesterol: 166 mg/dL (ref 0–200)
HDL: 48.1 mg/dL (ref >39.00)
LDL Cholesterol: 99 mg/dL (ref 0–99)
NonHDL: 118.37
Total CHOL/HDL Ratio: 3
Triglycerides: 97 mg/dL (ref 0.0–149.0)
VLDL: 19.4 mg/dL (ref 0.0–40.0)

## 2024-04-22 LAB — TSH: TSH: 1.91 u[IU]/mL (ref 0.35–5.50)

## 2024-06-15 ENCOUNTER — Encounter: Payer: Self-pay | Admitting: Internal Medicine

## 2024-06-15 DIAGNOSIS — N390 Urinary tract infection, site not specified: Secondary | ICD-10-CM

## 2024-06-16 ENCOUNTER — Other Ambulatory Visit (INDEPENDENT_AMBULATORY_CARE_PROVIDER_SITE_OTHER)

## 2024-06-16 DIAGNOSIS — N390 Urinary tract infection, site not specified: Secondary | ICD-10-CM | POA: Diagnosis not present

## 2024-06-16 LAB — URINALYSIS, ROUTINE W REFLEX MICROSCOPIC
Bilirubin Urine: NEGATIVE
Hgb urine dipstick: NEGATIVE
Ketones, ur: NEGATIVE
Leukocytes,Ua: NEGATIVE
Nitrite: NEGATIVE
Specific Gravity, Urine: 1.02 (ref 1.000–1.030)
Total Protein, Urine: NEGATIVE
Urine Glucose: NEGATIVE
Urobilinogen, UA: 0.2 (ref 0.0–1.0)
pH: 7 (ref 5.0–8.0)

## 2024-06-22 ENCOUNTER — Ambulatory Visit: Payer: Self-pay | Admitting: Internal Medicine

## 2024-09-01 ENCOUNTER — Ambulatory Visit: Admitting: Obstetrics and Gynecology
